# Patient Record
Sex: Male | Born: 1935 | Race: White | Hispanic: No | Marital: Married | Smoking: Never smoker
Health system: Southern US, Community
[De-identification: ages and names within clinical notes are randomized; demographics above are authoritative.]

## PROBLEM LIST (undated history)

## (undated) DIAGNOSIS — I10 Essential (primary) hypertension: Secondary | ICD-10-CM

## (undated) DIAGNOSIS — R109 Unspecified abdominal pain: Secondary | ICD-10-CM

## (undated) DIAGNOSIS — Z972 Presence of dental prosthetic device (complete) (partial): Secondary | ICD-10-CM

## (undated) DIAGNOSIS — N281 Cyst of kidney, acquired: Secondary | ICD-10-CM

## (undated) DIAGNOSIS — Z974 Presence of external hearing-aid: Secondary | ICD-10-CM

## (undated) DIAGNOSIS — D696 Thrombocytopenia, unspecified: Secondary | ICD-10-CM

## (undated) DIAGNOSIS — I878 Other specified disorders of veins: Secondary | ICD-10-CM

## (undated) DIAGNOSIS — N401 Enlarged prostate with lower urinary tract symptoms: Secondary | ICD-10-CM

## (undated) DIAGNOSIS — N138 Other obstructive and reflux uropathy: Secondary | ICD-10-CM

## (undated) DIAGNOSIS — B353 Tinea pedis: Secondary | ICD-10-CM

## (undated) DIAGNOSIS — E785 Hyperlipidemia, unspecified: Secondary | ICD-10-CM

## (undated) DIAGNOSIS — R001 Bradycardia, unspecified: Secondary | ICD-10-CM

## (undated) DIAGNOSIS — H9190 Unspecified hearing loss, unspecified ear: Secondary | ICD-10-CM

## (undated) DIAGNOSIS — N200 Calculus of kidney: Secondary | ICD-10-CM

## (undated) DIAGNOSIS — R55 Syncope and collapse: Secondary | ICD-10-CM

## (undated) DIAGNOSIS — I495 Sick sinus syndrome: Secondary | ICD-10-CM

## (undated) DIAGNOSIS — R000 Tachycardia, unspecified: Secondary | ICD-10-CM

## (undated) DIAGNOSIS — N419 Inflammatory disease of prostate, unspecified: Secondary | ICD-10-CM

## (undated) DIAGNOSIS — E119 Type 2 diabetes mellitus without complications: Secondary | ICD-10-CM

## (undated) DIAGNOSIS — I442 Atrioventricular block, complete: Secondary | ICD-10-CM

## (undated) DIAGNOSIS — D649 Anemia, unspecified: Secondary | ICD-10-CM

## (undated) HISTORY — DX: Type 2 diabetes mellitus without complications: E11.9

## (undated) HISTORY — DX: Essential (primary) hypertension: I10

## (undated) HISTORY — PX: HERNIA REPAIR: SHX51

## (undated) HISTORY — DX: Calculus of kidney: N20.0

## (undated) HISTORY — DX: Other obstructive and reflux uropathy: N13.8

## (undated) HISTORY — DX: Unspecified abdominal pain: R10.9

## (undated) HISTORY — DX: Hyperlipidemia, unspecified: E78.5

## (undated) HISTORY — DX: Other obstructive and reflux uropathy: N40.1

## (undated) HISTORY — DX: Cyst of kidney, acquired: N28.1

---

## 2006-07-17 ENCOUNTER — Ambulatory Visit: Payer: Self-pay | Admitting: Internal Medicine

## 2010-05-04 ENCOUNTER — Ambulatory Visit: Payer: Self-pay | Admitting: Physician Assistant

## 2010-11-15 ENCOUNTER — Ambulatory Visit: Payer: Self-pay | Admitting: Surgery

## 2010-11-21 ENCOUNTER — Ambulatory Visit: Payer: Self-pay | Admitting: Surgery

## 2012-06-13 ENCOUNTER — Emergency Department: Payer: Self-pay | Admitting: Emergency Medicine

## 2012-06-13 LAB — URINALYSIS, COMPLETE
Protein: NEGATIVE
RBC,UR: 26 /HPF (ref 0–5)
Specific Gravity: 1.018 (ref 1.003–1.030)
Squamous Epithelial: NONE SEEN
WBC UR: 3 /HPF (ref 0–5)

## 2012-06-13 LAB — CBC
HGB: 15.3 g/dL (ref 13.0–18.0)
MCH: 28.6 pg (ref 26.0–34.0)
MCV: 84 fL (ref 80–100)
RDW: 13.8 % (ref 11.5–14.5)
WBC: 5.7 10*3/uL (ref 3.8–10.6)

## 2012-06-13 LAB — COMPREHENSIVE METABOLIC PANEL
Albumin: 3.8 g/dL (ref 3.4–5.0)
BUN: 27 mg/dL — ABNORMAL HIGH (ref 7–18)
Bilirubin,Total: 0.7 mg/dL (ref 0.2–1.0)
Calcium, Total: 8.9 mg/dL (ref 8.5–10.1)
Co2: 23 mmol/L (ref 21–32)
EGFR (Non-African Amer.): 46 — ABNORMAL LOW
Glucose: 151 mg/dL — ABNORMAL HIGH (ref 65–99)
Osmolality: 291 (ref 275–301)
Potassium: 3.3 mmol/L — ABNORMAL LOW (ref 3.5–5.1)
SGPT (ALT): 25 U/L (ref 12–78)
Total Protein: 6.6 g/dL (ref 6.4–8.2)

## 2012-06-30 ENCOUNTER — Ambulatory Visit: Payer: Self-pay

## 2012-07-30 ENCOUNTER — Ambulatory Visit: Payer: Self-pay | Admitting: Urology

## 2012-07-30 LAB — BODY FLUID CELL COUNT WITH DIFFERENTIAL
Lymphocytes: 0 %
Nucleated Cell Count: 0 /mm3
Other Cells BF: 0 %
Other Mononuclear Cells: 0 %

## 2012-07-30 LAB — APTT: Activated PTT: 28.4 secs (ref 23.6–35.9)

## 2012-07-30 LAB — PLATELET COUNT: Platelet: 146 10*3/uL — ABNORMAL LOW (ref 150–440)

## 2012-07-31 ENCOUNTER — Ambulatory Visit: Payer: Self-pay | Admitting: Urology

## 2012-08-28 ENCOUNTER — Ambulatory Visit: Payer: Self-pay | Admitting: Urology

## 2012-09-11 ENCOUNTER — Ambulatory Visit: Payer: Self-pay | Admitting: Urology

## 2012-10-01 ENCOUNTER — Ambulatory Visit: Payer: Self-pay | Admitting: Urology

## 2013-01-22 ENCOUNTER — Ambulatory Visit: Payer: Self-pay | Admitting: Urology

## 2013-07-22 DIAGNOSIS — I10 Essential (primary) hypertension: Secondary | ICD-10-CM | POA: Insufficient documentation

## 2013-08-31 ENCOUNTER — Ambulatory Visit: Payer: Self-pay | Admitting: Urology

## 2013-11-16 ENCOUNTER — Emergency Department: Payer: Self-pay | Admitting: Emergency Medicine

## 2013-11-16 LAB — COMPREHENSIVE METABOLIC PANEL
ALK PHOS: 88 U/L
ANION GAP: 9 (ref 7–16)
Albumin: 3.8 g/dL (ref 3.4–5.0)
BUN: 26 mg/dL — ABNORMAL HIGH (ref 7–18)
Bilirubin,Total: 0.8 mg/dL (ref 0.2–1.0)
CREATININE: 1.74 mg/dL — AB (ref 0.60–1.30)
Calcium, Total: 8.3 mg/dL — ABNORMAL LOW (ref 8.5–10.1)
Chloride: 103 mmol/L (ref 98–107)
Co2: 31 mmol/L (ref 21–32)
EGFR (Non-African Amer.): 40 — ABNORMAL LOW
GFR CALC AF AMER: 48 — AB
Glucose: 155 mg/dL — ABNORMAL HIGH (ref 65–99)
Osmolality: 293 (ref 275–301)
Potassium: 4 mmol/L (ref 3.5–5.1)
SGOT(AST): 23 U/L (ref 15–37)
SGPT (ALT): 24 U/L
SODIUM: 143 mmol/L (ref 136–145)
Total Protein: 6.9 g/dL (ref 6.4–8.2)

## 2013-11-16 LAB — CBC WITH DIFFERENTIAL/PLATELET
BASOS ABS: 0 10*3/uL (ref 0.0–0.1)
Basophil %: 0.1 %
EOS ABS: 0 10*3/uL (ref 0.0–0.7)
Eosinophil %: 0.2 %
HCT: 45.4 % (ref 40.0–52.0)
HGB: 14.6 g/dL (ref 13.0–18.0)
Lymphocyte #: 0.7 10*3/uL — ABNORMAL LOW (ref 1.0–3.6)
Lymphocyte %: 7.1 %
MCH: 27 pg (ref 26.0–34.0)
MCHC: 32.2 g/dL (ref 32.0–36.0)
MCV: 84 fL (ref 80–100)
MONOS PCT: 9.5 %
Monocyte #: 0.9 x10 3/mm (ref 0.2–1.0)
Neutrophil #: 8.1 10*3/uL — ABNORMAL HIGH (ref 1.4–6.5)
Neutrophil %: 83.1 %
Platelet: 146 10*3/uL — ABNORMAL LOW (ref 150–440)
RBC: 5.41 10*6/uL (ref 4.40–5.90)
RDW: 15.1 % — ABNORMAL HIGH (ref 11.5–14.5)
WBC: 9.8 10*3/uL (ref 3.8–10.6)

## 2013-11-16 LAB — URINALYSIS, COMPLETE
BILIRUBIN, UR: NEGATIVE
BLOOD: NEGATIVE
Bacteria: NONE SEEN
GLUCOSE, UR: NEGATIVE mg/dL (ref 0–75)
Leukocyte Esterase: NEGATIVE
NITRITE: NEGATIVE
Ph: 5 (ref 4.5–8.0)
Protein: 30
RBC,UR: 2 /HPF (ref 0–5)
SQUAMOUS EPITHELIAL: NONE SEEN
Specific Gravity: 1.02 (ref 1.003–1.030)
WBC UR: 3 /HPF (ref 0–5)

## 2013-12-29 ENCOUNTER — Ambulatory Visit: Payer: Self-pay | Admitting: Internal Medicine

## 2014-06-26 IMAGING — CR DG ABDOMEN 1V
1 series · 2 of 2 positions shown · non-contrast
Comparison: none

REASON FOR EXAM: Calculus
COMMENTS:

[Series 1: t abdomen supine · 0.14mm/px · 2 of 2 slices shown]
[im 1/2]
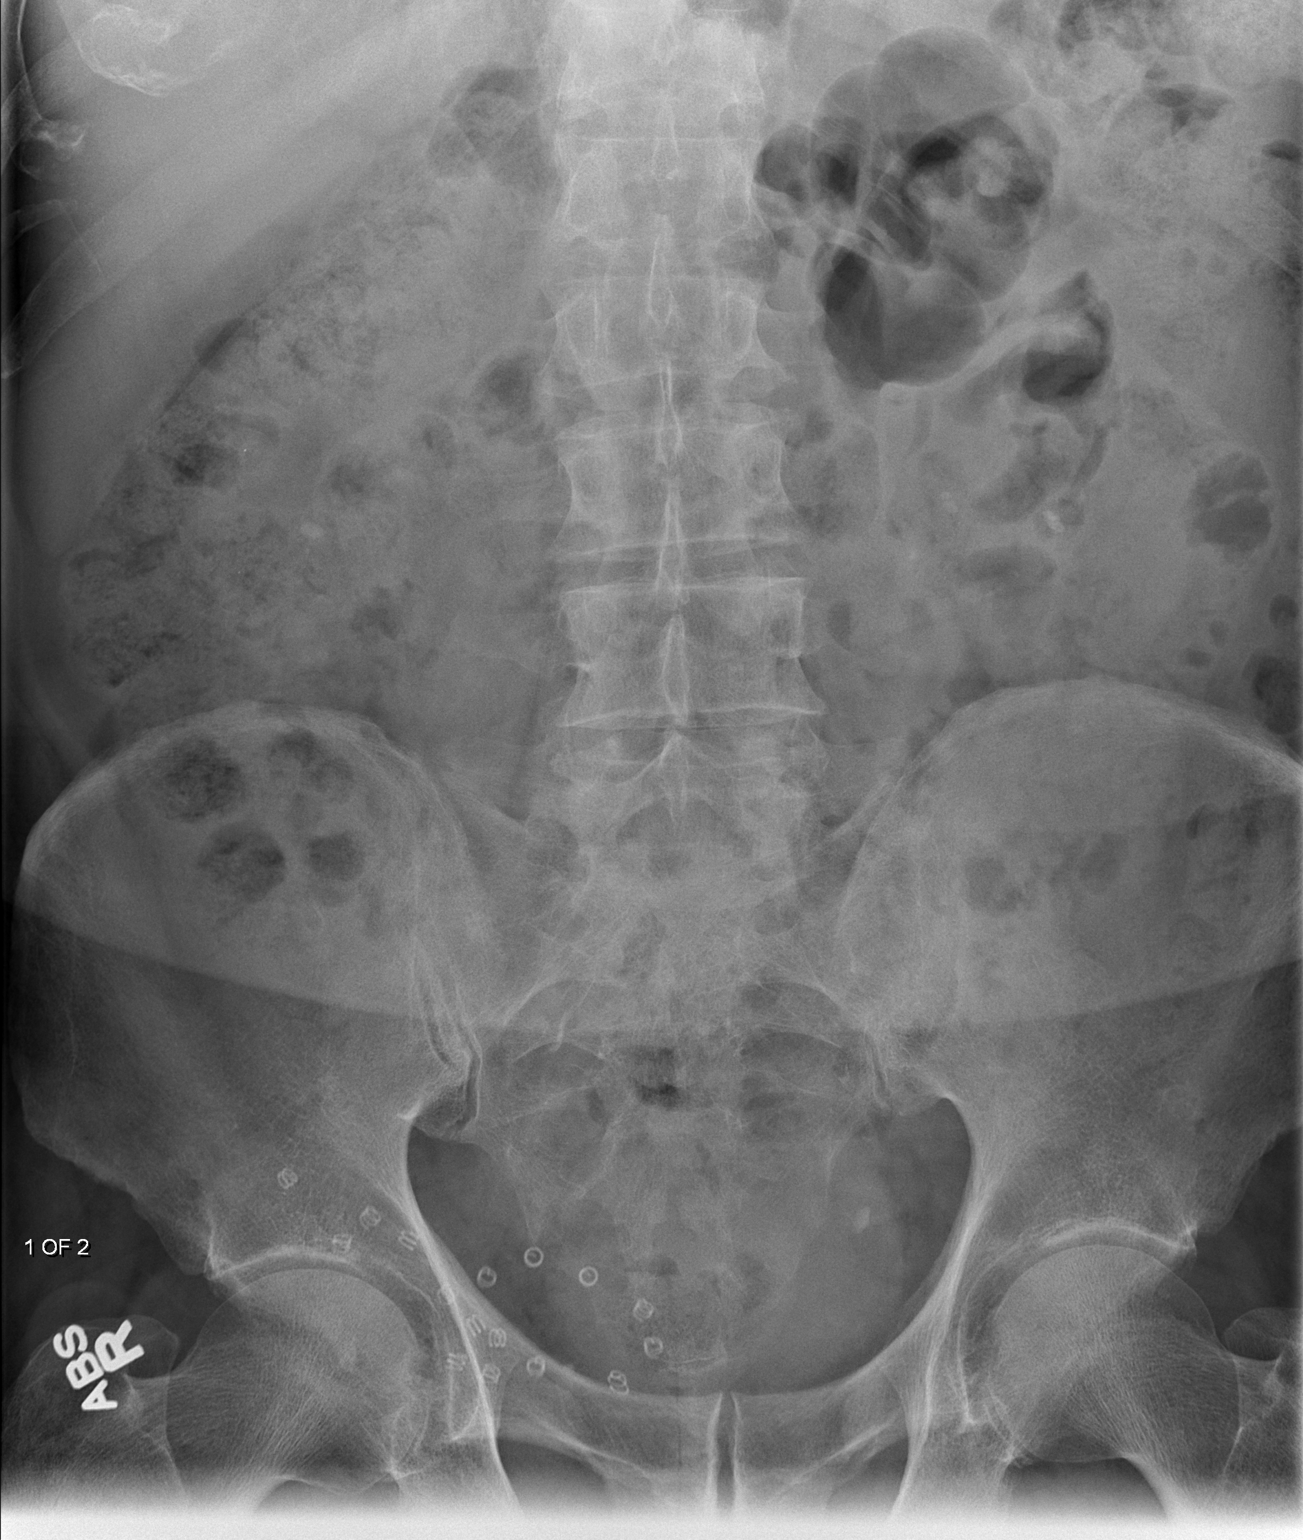
[im 2/2]
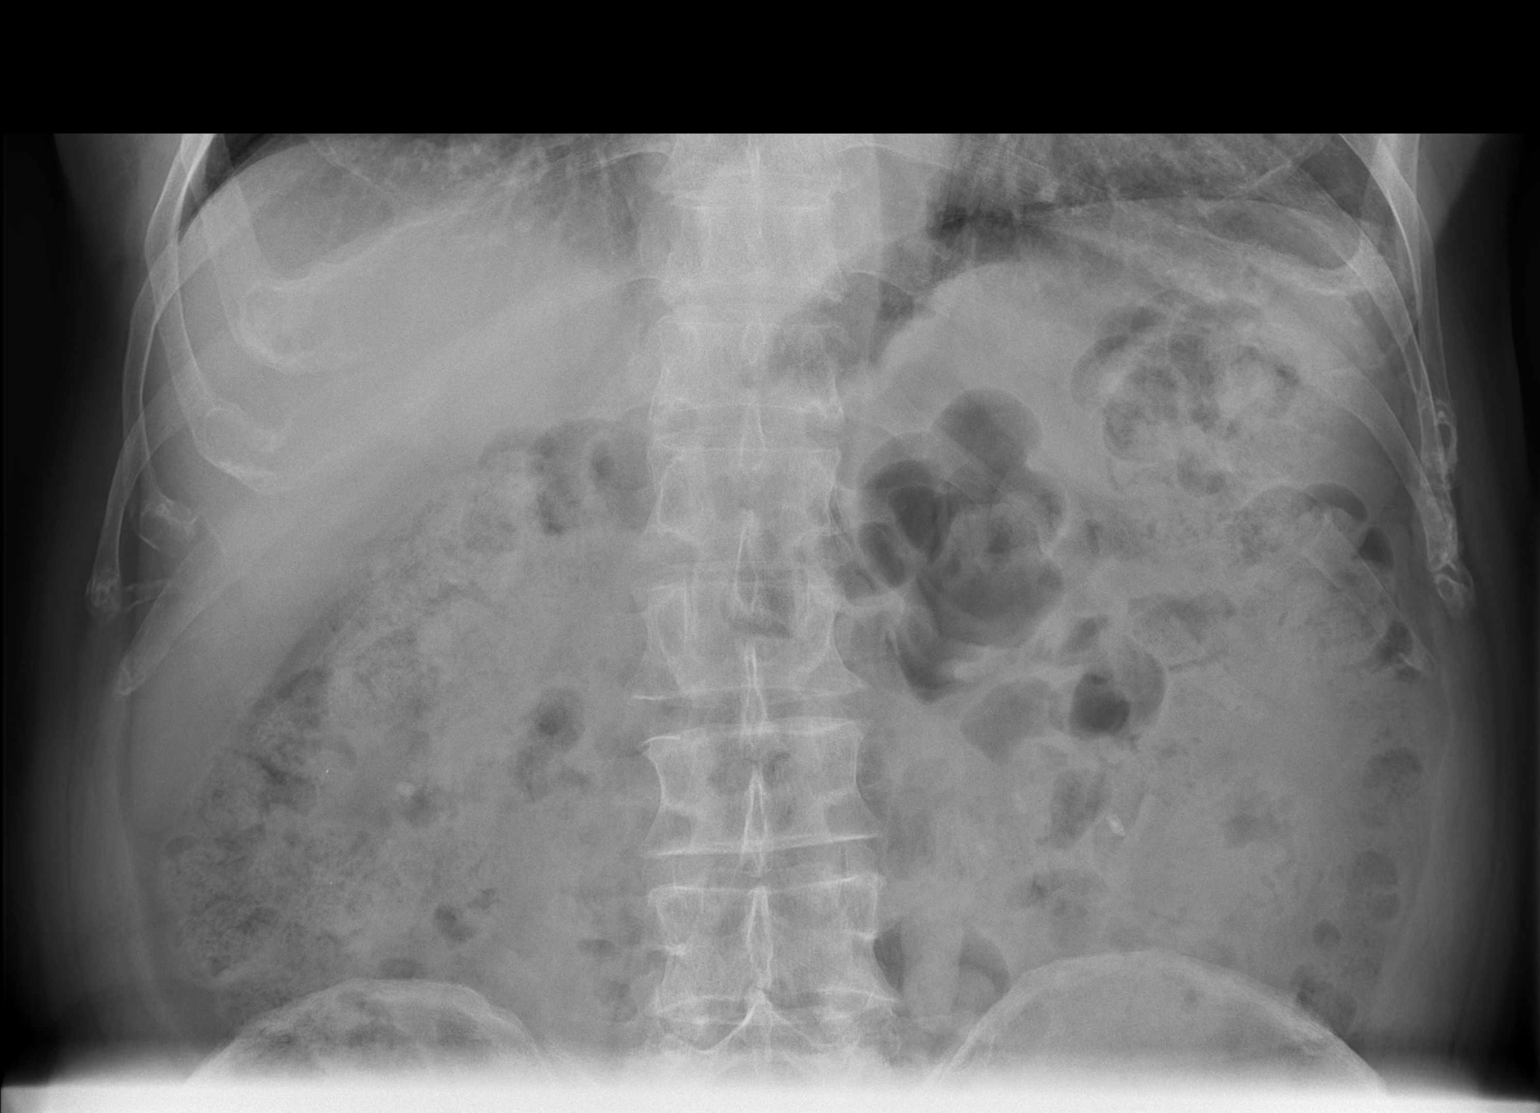

[2 of 2 positions shown; findings below may reference images not displayed]

PROCEDURE:     DXR - DXR KIDNEY URETER BLADDER  - September 11, 2012 [DATE]

RESULT:     Comparison is made to the study of 08/28/2012.

Stones are present in both kidneys consistent with bilateral
nephrolithiasis. There is a moderate amount of fecal material scattered
through the colon to the rectum. No definite ureteral stones are
appreciated. There is stable calcification in the left pelvis this could
represent a distal ureteral calcification normal left.
IMPRESSION: Stable appearance.

[REDACTED]

## 2014-08-06 ENCOUNTER — Other Ambulatory Visit: Payer: Self-pay | Admitting: Internal Medicine

## 2014-08-06 ENCOUNTER — Ambulatory Visit
Admission: RE | Admit: 2014-08-06 | Discharge: 2014-08-06 | Disposition: A | Discharge status: Home / Self Care | Payer: Medicare Other | Source: Ambulatory Visit | Attending: Internal Medicine | Admitting: Internal Medicine

## 2014-08-06 DIAGNOSIS — M25473 Effusion, unspecified ankle: Secondary | ICD-10-CM

## 2014-09-03 ENCOUNTER — Ambulatory Visit: Payer: Self-pay | Admitting: Urology

## 2014-09-07 ENCOUNTER — Encounter: Payer: Self-pay | Admitting: Urology

## 2014-09-17 ENCOUNTER — Ambulatory Visit (INDEPENDENT_AMBULATORY_CARE_PROVIDER_SITE_OTHER): Payer: Medicare Other | Admitting: Urology

## 2014-09-17 ENCOUNTER — Encounter: Payer: Self-pay | Admitting: Urology

## 2014-09-17 ENCOUNTER — Other Ambulatory Visit: Payer: Self-pay | Admitting: *Deleted

## 2014-09-17 VITALS — BP 190/80 | HR 57 | Ht 68.0 in | Wt 189.8 lb

## 2014-09-17 DIAGNOSIS — N200 Calculus of kidney: Secondary | ICD-10-CM

## 2014-09-17 DIAGNOSIS — R339 Retention of urine, unspecified: Secondary | ICD-10-CM

## 2014-09-17 LAB — URINALYSIS, COMPLETE
BILIRUBIN UA: NEGATIVE
Glucose, UA: NEGATIVE
Ketones, UA: NEGATIVE
LEUKOCYTES UA: NEGATIVE
Nitrite, UA: NEGATIVE
PH UA: 7 (ref 5.0–7.5)
PROTEIN UA: NEGATIVE
RBC UA: NEGATIVE
SPEC GRAV UA: 1.02 (ref 1.005–1.030)
Urobilinogen, Ur: 0.2 mg/dL (ref 0.2–1.0)

## 2014-09-17 LAB — MICROSCOPIC EXAMINATION
BACTERIA UA: NONE SEEN
Epithelial Cells (non renal): NONE SEEN /hpf (ref 0–10)

## 2014-09-17 LAB — BLADDER SCAN AMB NON-IMAGING: Scan Result: 430

## 2014-09-17 MED ORDER — CIPROFLOXACIN HCL 500 MG PO TABS
500.0000 mg | ORAL_TABLET | Freq: Once | ORAL | Status: AC
  Administered 2014-09-17: 500 mg via ORAL

## 2014-09-17 MED ORDER — TAMSULOSIN HCL 0.4 MG PO CAPS
0.4000 mg | ORAL_CAPSULE | Freq: Every day | ORAL | Status: DC
Start: 2014-09-17 — End: 2021-09-21

## 2014-09-17 MED ORDER — LIDOCAINE HCL 2 % EX GEL
1.0000 "application " | Freq: Once | CUTANEOUS | Status: AC
  Administered 2014-09-17: 1 via URETHRAL

## 2014-09-17 MED ORDER — FINASTERIDE 5 MG PO TABS: 5.0000 mg | ORAL_TABLET | Freq: Every day | ORAL | Status: DC

## 2014-09-17 NOTE — Progress Notes (Signed)
    Cystoscopy Procedure Note  Patient identification was confirmed, informed consent was obtained, and patient was prepped using Betadine solution.  Lidocaine jelly was administered per urethral meatus.    Preoperative abx where received prior to procedure.     Pre-Procedure: - Inspection reveals a normal caliber ureteral meatus.  Procedure: The flexible cystoscope was introduced without difficulty - No urethral strictures/lesions are present. -  prostate is some middle lobe hypertrophy with some lateral lobe hypertrophy does not appear to be a surgical prostate yet. -  bladder neck middle lobe hypertrophy of the prostate - Bilateral ureteral orifices identified - Bladder mucosa  reveals no ulcers, tumors, or lesions - No bladder stones - trabeculation grade 3 trabeculation  Retroflexion shows no tumors or masses in the anterior portion of the prostate date or the bladder   Post-Procedure: - Patient tolerated the procedure well \

## 2014-09-17 NOTE — Progress Notes (Signed)
09/17/2014 10:39 AM   Albert Harrison 04-28-1925 [id number]  Referring provider: Tracie Harrier, MD [place], [place] [address]  Chief Complaint  Patient presents with  . Follow-up    22yr f/u of kidney stones    HPI: Today the patient is complaining of some leakage of urine and inability to void large amounts of urine. He is having no calculi pain. Normally he is checked for a renal ultrasound before coming in but he did not obtain on this time so I ordered a renal ultrasound to check on the progress of his cyst after drainage of 1800 mL of a renal cyst on the left. This is a percutaneous drainage. He is having none of the signs or symptoms of this large cyst that he had in the past.   PMH: Past Medical History  Diagnosis Date  . Hyperlipemia   . Hypertension   . Renal cyst   . BPH (benign prostatic hypertrophy) with urinary obstruction   . Diabetes   . Abdominal pain   . Renal stone     Surgical History: Past Surgical History  Procedure Laterality Date  . Hernia repair      Home Medications:    Medication List       This list is accurate as of: 09/17/14 10:39 AM.  Always use your most recent med list.               Coenzyme Q10 200 MG capsule  Take by mouth.     finasteride 5 MG tablet  Commonly known as:  PROSCAR  Take 1 tablet (5 mg total) by mouth daily.     furosemide 20 MG tablet  Commonly known as:  LASIX  Take by mouth.     hydrochlorothiazide 25 MG tablet  Commonly known as:  HYDRODIURIL  Take by mouth.     ibuprofen 200 MG tablet  Commonly known as:  ADVIL,MOTRIN  Take by mouth.     losartan 50 MG tablet  Commonly known as:  COZAAR  Take by mouth.     simvastatin 40 MG tablet  Commonly known as:  ZOCOR  Take by mouth.     tamsulosin 0.4 MG Caps capsule  Commonly known as:  FLOMAX  Take by mouth.     tamsulosin 0.4 MG Caps capsule  Commonly known as:  FLOMAX  Take 1 capsule (0.4 mg total) by mouth daily.          Allergies:  Allergies  Allergen Reactions  . Morphine Other (See Comments)    Family History: No family history on file.  Social History:  reports that he has never smoked. He does not have any smokeless tobacco history on file. He reports that he does not drink alcohol or use illicit drugs.  ROS: UROLOGY Frequent Urination?: Yes Hard to postpone urination?: No Burning/pain with urination?: No Get up at night to urinate?: No Leakage of urine?: Yes Urine stream starts and stops?: No Trouble starting stream?: No Do you have to strain to urinate?: Yes Blood in urine?: No Urinary tract infection?: No Sexually transmitted disease?: No Injury to kidneys or bladder?: No Painful intercourse?: No Weak stream?: No Erection problems?: No Penile pain?: No  Gastrointestinal Nausea?: No Vomiting?: No Indigestion/heartburn?: No Diarrhea?: No Constipation?: No  Constitutional Fever: No Night sweats?: No Weight loss?: No Fatigue?: No  Skin Skin rash/lesions?: No Itching?: No  Eyes Blurred vision?: No Double vision?: No  Ears/Nose/Throat Sore throat?: No Sinus problems?: No  Hematologic/Lymphatic Swollen glands?: No Easy bruising?: No  Cardiovascular Leg swelling?: No Chest pain?: No  Respiratory Cough?: No Shortness of breath?: No  Endocrine Excessive thirst?: No  Musculoskeletal Back pain?: No Joint pain?: Yes  Neurological Headaches?: No Dizziness?: No  Psychologic Depression?: No Anxiety?: No  Physical Exam: BP 190/80 mmHg  Pulse 57  Ht 5\' 8"  (1.727 m)  Wt 189 lb 12.8 oz (86.093 kg)  BMI 28.87 kg/m2  Constitutional:  Alert and oriented, No acute distress. HEENT: [place] AT, moist mucus membranes.  Trachea midline, no masses. Cardiovascular: No clubbing, cyanosis, or edema. Respiratory: Normal respiratory effort, no increased work of breathing. GI: Abdomen is soft, nontender, nondistended, no abdominal masses GU: No CVA tenderness.  Negative Lloyd's Murphy's a McBurney's Skin: No rashes, bruises or suspicious lesions. Lymph: No cervical or inguinal adenopathy. Neurologic: Grossly intact, no focal deficits, moving all 4 extremities. Psychiatric: Normal mood and affect.  Laboratory Data: Lab Results  Component Value Date   WBC 9.8 11/16/2013   HGB 14.6 11/16/2013   HCT 45.4 11/16/2013   MCV 84 11/16/2013   PLT 146* 11/16/2013    Lab Results  Component Value Date   CREATININE 1.74* 11/16/2013    No results found for: PSA  No results found for: TESTOSTERONE  No results found for: HGBA1C  Urinalysis    Component Value Date/Time   COLORURINE Yellow 11/16/2013 0945   APPEARANCEUR Clear 11/16/2013 0945   LABSPEC 1.020 11/16/2013 0945   PHURINE 5.0 11/16/2013 0945   GLUCOSEU Negative 11/16/2013 0945   HGBUR Negative 11/16/2013 0945   BILIRUBINUR Negative 11/16/2013 0945   KETONESUR Trace 11/16/2013 0945   PROTEINUR 30 mg/dL 11/16/2013 0945   NITRITE Negative 11/16/2013 0945   LEUKOCYTESUR Negative 11/16/2013 0945    Pertinent Imaging: Order renal ultrasound  Assessment & Plan:  History of stones renal cyst for which I'll get an ultrasound to see if it's come back these had 1800 mL in the last cyst drainage. He is experiencing no abdominal pain at this time and no distention so I doubt this cyst is come back in his large size. There still is some cyst present on his last check in 6 months ago. His also complaining of frequency of urination and inability to void once he leaks. Postvoid residual beef was 400 mL. Cystoscopic exam revealed some BPH probably partially obstructive and I put him on tamsulosin and finasteride. I do not think is operable prostate. Rectal he has some slight area of calcification or nodularity on the right side. Does not feel like a cancerous nodule. At 89 I'm reticent to  obtain a PSA  1. Kidney stones historical previous lithotripsy for a obstructing left  upper ureter stone no  signs or symptoms of calculus N spell removal obtain a renal ultrasound for s - Urinalysis, Complete - US Renal; Future  2. Urinary retention Secondary to BPH and patient's and placed on finasteride and tamsulosin postvoid residual before voiding second time was 410 voided about 250-300 so I think his outlet obstructions minimal and but present that is why put him on the tamsulosin and finasteride follow-ups in a m - Bladder Scan (Post Void Residual) in office - ciprofloxacin (CIPRO) tablet 500 mg; Take 1 tablet (500 mg total) by mouth once. - lidocaine (XYLOCAINE) 2 % jelly 1 application; Place 1 application into the urethra once. - US Renal; Future - finasteride (PROSCAR) 5 MG tablet; Take 1 tablet (5 mg total) by mouth daily.  Dispense: 90 tablet; Refill:  2 - tamsulosin (FLOMAX) 0.4 MG CAPS capsule; Take 1 capsule (0.4 mg total) by mouth daily.  Dispense: 90 capsule; Refill: 1   No Follow-up on file.  Collier Flowers, [facility] [address] [place], [place] [address] [phone]

## 2014-10-08 ENCOUNTER — Ambulatory Visit: Payer: Medicare Other

## 2014-10-22 ENCOUNTER — Ambulatory Visit
Admission: RE | Admit: 2014-10-22 | Discharge: 2014-10-22 | Disposition: A | Discharge status: Home / Self Care | Payer: Medicare Other | Source: Ambulatory Visit | Attending: Urology | Admitting: Urology

## 2014-10-22 DIAGNOSIS — N200 Calculus of kidney: Secondary | ICD-10-CM | POA: Insufficient documentation

## 2014-10-22 DIAGNOSIS — N281 Cyst of kidney, acquired: Secondary | ICD-10-CM | POA: Insufficient documentation

## 2014-10-22 DIAGNOSIS — R339 Retention of urine, unspecified: Secondary | ICD-10-CM

## 2014-10-26 ENCOUNTER — Encounter: Payer: Self-pay | Admitting: Urology

## 2014-10-26 ENCOUNTER — Ambulatory Visit (INDEPENDENT_AMBULATORY_CARE_PROVIDER_SITE_OTHER): Payer: Medicare Other | Admitting: Urology

## 2014-10-26 VITALS — BP 159/71 | HR 59 | Ht 69.0 in | Wt 192.9 lb

## 2014-10-26 DIAGNOSIS — N400 Enlarged prostate without lower urinary tract symptoms: Secondary | ICD-10-CM | POA: Diagnosis not present

## 2014-10-26 DIAGNOSIS — D649 Anemia, unspecified: Secondary | ICD-10-CM | POA: Insufficient documentation

## 2014-10-26 DIAGNOSIS — Q6100 Congenital renal cyst, unspecified: Secondary | ICD-10-CM

## 2014-10-26 DIAGNOSIS — N281 Cyst of kidney, acquired: Secondary | ICD-10-CM

## 2014-10-26 DIAGNOSIS — H9190 Unspecified hearing loss, unspecified ear: Secondary | ICD-10-CM | POA: Insufficient documentation

## 2014-10-26 DIAGNOSIS — R350 Frequency of micturition: Secondary | ICD-10-CM | POA: Diagnosis not present

## 2014-10-26 DIAGNOSIS — E785 Hyperlipidemia, unspecified: Secondary | ICD-10-CM | POA: Insufficient documentation

## 2014-10-26 LAB — MICROSCOPIC EXAMINATION
BACTERIA UA: NONE SEEN
Epithelial Cells (non renal): NONE SEEN /hpf (ref 0–10)

## 2014-10-26 LAB — URINALYSIS, COMPLETE
Bilirubin, UA: NEGATIVE
Glucose, UA: NEGATIVE
Ketones, UA: NEGATIVE
Leukocytes, UA: NEGATIVE
NITRITE UA: NEGATIVE
PH UA: 6 (ref 5.0–7.5)
Protein, UA: NEGATIVE
RBC UA: NEGATIVE
Specific Gravity, UA: 1.02 (ref 1.005–1.030)
UUROB: 0.2 mg/dL (ref 0.2–1.0)

## 2014-10-26 NOTE — Progress Notes (Signed)
10/26/2014 2:03 PM   Albert Harrison 03/20/1925 [id number]  Referring provider: Tracie Harrier, MD [address] [place] [facility] [place], [place] [address]  Chief Complaint  Patient presents with  . Follow-up    renal ultrasound for history stones renal cyst , BPH    HPI: Previous history large renal cyst 1800 mL capacity. Presently cystoscopy is 11 x 7 x 12 cm. Has some echogenicity. It is not painful to him. We will redo the ultrasound in a year. There are no masses or stones in his kidney he is voiding well.     PMH: Past Medical History  Diagnosis Date  . Hyperlipemia   . Hypertension   . Renal cyst   . BPH (benign prostatic hypertrophy) with urinary obstruction   . Diabetes ([place])   . Abdominal pain   . Renal stone     Surgical History: Past Surgical History  Procedure Laterality Date  . Hernia repair      Home Medications:    Medication List       This list is accurate as of: 10/26/14  2:03 PM.  Always use your most recent med list.               Coenzyme Q10 200 MG capsule  Take by mouth.     finasteride 5 MG tablet  Commonly known as:  PROSCAR  Take 1 tablet (5 mg total) by mouth daily.     furosemide 20 MG tablet  Commonly known as:  LASIX  Take by mouth.     hydrochlorothiazide 25 MG tablet  Commonly known as:  HYDRODIURIL  Take by mouth.     ibuprofen 200 MG tablet  Commonly known as:  ADVIL,MOTRIN  Take by mouth.     losartan 50 MG tablet  Commonly known as:  COZAAR  Take by mouth.     simvastatin 40 MG tablet  Commonly known as:  ZOCOR  Take by mouth.     tamsulosin 0.4 MG Caps capsule  Commonly known as:  FLOMAX  Take by mouth.     tamsulosin 0.4 MG Caps capsule  Commonly known as:  FLOMAX  Take 1 capsule (0.4 mg total) by mouth daily.        Allergies:  Allergies  Allergen Reactions  . Morphine Other (See Comments)    Family History: No family history on file.  Social History:  reports that he has  never smoked. He does not have any smokeless tobacco history on file. He reports that he does not drink alcohol or use illicit drugs.  ROS: UROLOGY Frequent Urination?: Yes Hard to postpone urination?: No Burning/pain with urination?: No Get up at night to urinate?: No Leakage of urine?: No Urine stream starts and stops?: No Trouble starting stream?: No Do you have to strain to urinate?: No Blood in urine?: No Urinary tract infection?: No Sexually transmitted disease?: No Injury to kidneys or bladder?: No Painful intercourse?: No Weak stream?: No Erection problems?: No Penile pain?: No  Gastrointestinal Nausea?: No Vomiting?: No Indigestion/heartburn?: No Diarrhea?: No Constipation?: No  Constitutional Fever: No Night sweats?: No Weight loss?: No Fatigue?: No  Skin Skin rash/lesions?: No  Eyes Blurred vision?: No Double vision?: No  Ears/Nose/Throat Sore throat?: No Sinus problems?: No  Hematologic/Lymphatic Swollen glands?: No Easy bruising?: No  Cardiovascular Leg swelling?: No Chest pain?: No  Respiratory Cough?: No Shortness of breath?: No  Endocrine Excessive thirst?: No  Musculoskeletal Back pain?: No Joint pain?: Yes  Neurological Headaches?: No Dizziness?: No  Psychologic Depression?: No Anxiety?: No  Physical Exam: BP 159/71 mmHg  Pulse 59  Ht 5\' 9"  (1.753 m)  Wt 192 lb 14.4 oz (87.499 kg)  BMI 28.47 kg/m2  Constitutional:  Alert and oriented, No acute distress. HEENT: [place] AT, moist mucus membranes.  Trachea midline, no masses. Cardiovascular: No clubbing, cyanosis, or edema. Respiratory: Normal respiratory effort, no increased work of breathing. GI: Abdomen is soft, nontender, nondistended, no abdominal masses GU: No CVA tenderness.  Skin: No rashes, bruises or suspicious lesions. Lymph: No cervical or inguinal adenopathy. Neurologic: Grossly intact, no focal deficits, moving all 4 extremities. Psychiatric: Normal mood  and affect.  Laboratory Data: Lab Results  Component Value Date   WBC 9.8 11/16/2013   HGB 14.6 11/16/2013   HCT 45.4 11/16/2013   MCV 84 11/16/2013   PLT 146* 11/16/2013    Lab Results  Component Value Date   CREATININE 1.74* 11/16/2013    No results found for: PSA  No results found for: TESTOSTERONE  No results found for: HGBA1C  Urinalysis    Component Value Date/Time   COLORURINE Yellow 11/16/2013 0945   APPEARANCEUR Clear 11/16/2013 0945   LABSPEC 1.020 11/16/2013 0945   PHURINE 5.0 11/16/2013 0945   GLUCOSEU Negative 09/17/2014 0952   GLUCOSEU Negative 11/16/2013 0945   HGBUR Negative 11/16/2013 0945   BILIRUBINUR Negative 09/17/2014 0952   BILIRUBINUR Negative 11/16/2013 0945   KETONESUR Trace 11/16/2013 0945   PROTEINUR 30 mg/dL 11/16/2013 0945   NITRITE Negative 09/17/2014 0952   NITRITE Negative 11/16/2013 0945   LEUKOCYTESUR Negative 09/17/2014 0952   LEUKOCYTESUR Negative 11/16/2013 0945    Pertinent Imaging none  Assessment & Plan:  Previous large renal cyst 1800 mL capacity with deviation of the ureter on the left side. He's had this drained 2 years ago and is doing quite well. He has no pain and he has a very much decreased renal cyst size. He has no calculi in his kidneys. I will get another ultrasound in a year. This will be good screening for his renal calculi and his large cyst at has been drained fairly successfully. There is some complexity of the cyst but I would suspect that is had a drain. He is having no fever chills or pain. When he had the large 1800 mL cyst his abdomen was distended by. He is quite comfortable now not complaining of any  pain  1. BPH (benign prostatic hyperplasia) voiding well now with good stream. Will maintain the finasteride. Thrill he helped his frequency and urgency.  2.frequency Stopped by using tamsulosin and finasteride. I told him he could stop the tamsulosin just take finasteride see how that works. -  Urinalysis, Complete   No Follow-up on file.  Collier Flowers, [facility] [address] [place], [place] [address] [phone]

## 2014-10-26 NOTE — Addendum Note (Signed)
Addended by: Rick Duff D on: 10/26/2014 03:38 PM   Modules accepted: Orders

## 2014-11-16 ENCOUNTER — Other Ambulatory Visit: Payer: Self-pay

## 2014-11-16 DIAGNOSIS — N281 Cyst of kidney, acquired: Secondary | ICD-10-CM

## 2015-10-28 ENCOUNTER — Ambulatory Visit: Payer: Medicare Other | Admitting: Urology

## 2017-06-09 ENCOUNTER — Emergency Department: Payer: Medicare Other

## 2017-06-09 ENCOUNTER — Observation Stay
Admission: EM | Admit: 2017-06-09 | Discharge: 2017-06-10 | Disposition: A | Discharge status: Home / Self Care | Payer: Medicare Other | Attending: Internal Medicine | Admitting: Internal Medicine

## 2017-06-09 ENCOUNTER — Other Ambulatory Visit: Payer: Self-pay

## 2017-06-09 ENCOUNTER — Encounter: Payer: Self-pay | Admitting: Emergency Medicine

## 2017-06-09 DIAGNOSIS — Z87442 Personal history of urinary calculi: Secondary | ICD-10-CM | POA: Insufficient documentation

## 2017-06-09 DIAGNOSIS — Z885 Allergy status to narcotic agent status: Secondary | ICD-10-CM | POA: Diagnosis not present

## 2017-06-09 DIAGNOSIS — I10 Essential (primary) hypertension: Secondary | ICD-10-CM | POA: Diagnosis not present

## 2017-06-09 DIAGNOSIS — E119 Type 2 diabetes mellitus without complications: Secondary | ICD-10-CM | POA: Insufficient documentation

## 2017-06-09 DIAGNOSIS — I4510 Unspecified right bundle-branch block: Secondary | ICD-10-CM | POA: Diagnosis not present

## 2017-06-09 DIAGNOSIS — I441 Atrioventricular block, second degree: Secondary | ICD-10-CM | POA: Insufficient documentation

## 2017-06-09 DIAGNOSIS — E785 Hyperlipidemia, unspecified: Secondary | ICD-10-CM | POA: Diagnosis not present

## 2017-06-09 DIAGNOSIS — R001 Bradycardia, unspecified: Principal | ICD-10-CM | POA: Insufficient documentation

## 2017-06-09 DIAGNOSIS — Z79899 Other long term (current) drug therapy: Secondary | ICD-10-CM | POA: Diagnosis not present

## 2017-06-09 DIAGNOSIS — R55 Syncope and collapse: Secondary | ICD-10-CM | POA: Insufficient documentation

## 2017-06-09 DIAGNOSIS — N401 Enlarged prostate with lower urinary tract symptoms: Secondary | ICD-10-CM | POA: Insufficient documentation

## 2017-06-09 DIAGNOSIS — E876 Hypokalemia: Secondary | ICD-10-CM | POA: Insufficient documentation

## 2017-06-09 LAB — URINALYSIS, COMPLETE (UACMP) WITH MICROSCOPIC
BACTERIA UA: NONE SEEN
Bilirubin Urine: NEGATIVE
Glucose, UA: NEGATIVE mg/dL
HGB URINE DIPSTICK: NEGATIVE
KETONES UR: NEGATIVE mg/dL
Leukocytes, UA: NEGATIVE
Nitrite: NEGATIVE
PROTEIN: NEGATIVE mg/dL
Specific Gravity, Urine: 1.006 (ref 1.005–1.030)
Squamous Epithelial / LPF: NONE SEEN (ref 0–5)
pH: 6 (ref 5.0–8.0)

## 2017-06-09 LAB — BASIC METABOLIC PANEL
Anion gap: 13 (ref 5–15)
BUN: 21 mg/dL — AB (ref 6–20)
CO2: 26 mmol/L (ref 22–32)
CREATININE: 1.14 mg/dL (ref 0.61–1.24)
Calcium: 9 mg/dL (ref 8.9–10.3)
Chloride: 102 mmol/L (ref 101–111)
GFR calc Af Amer: >60 mL/min (ref >60)
GFR calc non Af Amer: 55 mL/min — ABNORMAL LOW (ref >60)
Glucose, Bld: 171 mg/dL — ABNORMAL HIGH (ref 65–99)
Potassium: 3.4 mmol/L — ABNORMAL LOW (ref 3.5–5.1)
Sodium: 141 mmol/L (ref 135–145)

## 2017-06-09 LAB — MAGNESIUM: MAGNESIUM: 1.9 mg/dL (ref 1.7–2.4)

## 2017-06-09 LAB — CBC WITH DIFFERENTIAL/PLATELET
Basophils Absolute: 0 10*3/uL (ref 0–0.1)
Basophils Relative: 0 %
EOS ABS: 0 10*3/uL (ref 0–0.7)
Eosinophils Relative: 1 %
HCT: 49.8 % (ref 40.0–52.0)
Hemoglobin: 17 g/dL (ref 13.0–18.0)
Lymphocytes Relative: 13 %
Lymphs Abs: 0.9 10*3/uL — ABNORMAL LOW (ref 1.0–3.6)
MCH: 30.7 pg (ref 26.0–34.0)
MCHC: 34.1 g/dL (ref 32.0–36.0)
MCV: 90.2 fL (ref 80.0–100.0)
MONO ABS: 0.5 10*3/uL (ref 0.2–1.0)
MONOS PCT: 7 %
Neutro Abs: 5.6 10*3/uL (ref 1.4–6.5)
Neutrophils Relative %: 79 %
Platelets: 122 10*3/uL — ABNORMAL LOW (ref 150–440)
RBC: 5.52 MIL/uL (ref 4.40–5.90)
RDW: 14.1 % (ref 11.5–14.5)
WBC: 7.1 10*3/uL (ref 3.8–10.6)

## 2017-06-09 LAB — TROPONIN I
Troponin I: <0.03 ng/mL (ref <0.03)
Troponin I: <0.03 ng/mL (ref <0.03)
Troponin I: <0.03 ng/mL (ref <0.03)

## 2017-06-09 LAB — PROTIME-INR
INR: 0.95
PROTHROMBIN TIME: 12.6 s (ref 11.4–15.2)

## 2017-06-09 LAB — GLUCOSE, CAPILLARY: GLUCOSE-CAPILLARY: 135 mg/dL — AB (ref 65–99)

## 2017-06-09 LAB — APTT: aPTT: <24 seconds — ABNORMAL LOW (ref 24–36)

## 2017-06-09 MED ORDER — INSULIN NPH (HUMAN) (ISOPHANE) 100 UNIT/ML ~~LOC~~ SUSP: 20.0000 [IU] | Freq: Once | SUBCUTANEOUS | Status: DC

## 2017-06-09 MED ORDER — SODIUM CHLORIDE 0.9 % IV BOLUS
500.0000 mL | Freq: Once | INTRAVENOUS | Status: AC
  Administered 2017-06-09: 500 mL via INTRAVENOUS

## 2017-06-09 MED ORDER — TAMSULOSIN HCL 0.4 MG PO CAPS: 0.4000 mg | ORAL_CAPSULE | Freq: Every day | ORAL | Status: DC

## 2017-06-09 MED ORDER — ENOXAPARIN SODIUM 40 MG/0.4ML ~~LOC~~ SOLN
40.0000 mg | SUBCUTANEOUS | Status: DC
  Administered 2017-06-09: 40 mg via SUBCUTANEOUS
  Filled 2017-06-09: qty 0.4

## 2017-06-09 MED ORDER — FINASTERIDE 5 MG PO TABS: 5.0000 mg | ORAL_TABLET | Freq: Every day | ORAL | Status: DC

## 2017-06-09 MED ORDER — ACETAMINOPHEN 650 MG RE SUPP: 650.0000 mg | Freq: Four times a day (QID) | RECTAL | Status: DC | PRN

## 2017-06-09 MED ORDER — SODIUM CHLORIDE 0.9% FLUSH: 3.0000 mL | INTRAVENOUS | Status: DC | PRN

## 2017-06-09 MED ORDER — POTASSIUM CHLORIDE CRYS ER 20 MEQ PO TBCR: 40.0000 meq | EXTENDED_RELEASE_TABLET | Freq: Once | ORAL | Status: DC

## 2017-06-09 MED ORDER — ACETAMINOPHEN 325 MG PO TABS: 650.0000 mg | ORAL_TABLET | Freq: Four times a day (QID) | ORAL | Status: DC | PRN

## 2017-06-09 MED ORDER — SODIUM CHLORIDE 0.9% FLUSH: 3.0000 mL | Freq: Two times a day (BID) | INTRAVENOUS | Status: DC

## 2017-06-09 MED ORDER — SODIUM CHLORIDE 0.9 % IV SOLN: INTRAVENOUS | Status: DC

## 2017-06-09 MED ORDER — SODIUM CHLORIDE 0.9% FLUSH
3.0000 mL | Freq: Two times a day (BID) | INTRAVENOUS | Status: DC
  Administered 2017-06-09: 3 mL via INTRAVENOUS

## 2017-06-09 MED ORDER — SODIUM CHLORIDE 0.9 % IV SOLN: 250.0000 mL | INTRAVENOUS | Status: DC | PRN

## 2017-06-09 MED ORDER — ONDANSETRON HCL 4 MG/2ML IJ SOLN
4.0000 mg | Freq: Four times a day (QID) | INTRAMUSCULAR | Status: DC | PRN
  Administered 2017-06-09 – 2017-06-10 (×2): 4 mg via INTRAVENOUS
  Filled 2017-06-09 (×2): qty 2

## 2017-06-09 MED ORDER — ONDANSETRON HCL 4 MG PO TABS: 4.0000 mg | ORAL_TABLET | Freq: Four times a day (QID) | ORAL | Status: DC | PRN

## 2017-06-09 MED ORDER — SENNOSIDES-DOCUSATE SODIUM 8.6-50 MG PO TABS: 1.0000 | ORAL_TABLET | Freq: Every evening | ORAL | Status: DC | PRN

## 2017-06-09 MED ORDER — SIMVASTATIN 20 MG PO TABS: 40.0000 mg | ORAL_TABLET | Freq: Every day | ORAL | Status: DC

## 2017-06-09 MED ORDER — INSULIN ASPART 100 UNIT/ML ~~LOC~~ SOLN: 0.0000 [IU] | Freq: Three times a day (TID) | SUBCUTANEOUS | Status: DC

## 2017-06-09 MED ORDER — HYDRALAZINE HCL 20 MG/ML IJ SOLN
10.0000 mg | INTRAMUSCULAR | Status: DC | PRN
  Administered 2017-06-09 – 2017-06-10 (×3): 10 mg via INTRAVENOUS
  Filled 2017-06-09 (×3): qty 1

## 2017-06-09 NOTE — ED Triage Notes (Signed)
Pt arrived via EMS from home with reports of fainting at home.  On arrival pt had heart rate in the 30s and 40s. Pt was alert but diaphoretic.  No neuro deficits per EMS.  Pt talking on arrival.  Pt was given 1mg  of Atropine with EMS and heart rate up in the 70s.

## 2017-06-09 NOTE — ED Provider Notes (Addendum)
[facility] Emergency Department Provider Note  ____________________________________________   I have reviewed the triage vital signs and the nursing notes. Where available I have reviewed prior notes and, if possible and indicated, outside hospital notes.    HISTORY  Chief Complaint Loss of Consciousness and Bradycardia    HPI Albert Harrison is a 84 y.o. male  Today complaining of feeling lightheaded.  EMS was called out because he was feeling lightheaded.  He was sitting around the house doing nothing.  He states that he became lightheaded and dizzy.  EMS found him with a heart rate in the 40s, but blood pressure was elevated, there was no trauma he did not pass out but he was somewhat difficult to arouse initially.  They gave him atropine and he seemed to wake up better.  Patient has no complaints of chest pain shortness of breath nausea vomiting or headache.  He denies any fall.  He has had nothing like this before.  He is not on any beta-blockers.  He does take losartan and as needed Lasix.  Patient has a known right bundle branch block and a known first-degree heart block.  Patient has had no vomiting or diarrhea.  He has been feeling otherwise well.   Past Medical History:  Diagnosis Date  . Abdominal pain   . BPH (benign prostatic hypertrophy) with urinary obstruction   . Diabetes ([place])   . Hyperlipemia   . Hypertension   . Renal cyst   . Renal stone     Patient Active Problem List   Diagnosis Date Noted  . Absolute anemia 10/26/2014  . Difficulty hearing 10/26/2014  . HLD (hyperlipidemia) 10/26/2014  . BP (high blood pressure) 07/22/2013    Past Surgical History:  Procedure Laterality Date  . HERNIA REPAIR      Prior to Admission medications   Medication Sig Start Date End Date Taking? Authorizing Provider  Coenzyme Q10 200 MG capsule Take by mouth.    [provider]  finasteride (PROSCAR) 5 MG tablet Take 1 tablet (5 mg total) by  mouth daily. 09/17/14   Collier Flowers, MD  furosemide (LASIX) 20 MG tablet Take by mouth. 08/06/14 08/06/15  [provider]  hydrochlorothiazide (HYDRODIURIL) 25 MG tablet Take by mouth. 08/03/14   [provider]  ibuprofen (ADVIL,MOTRIN) 200 MG tablet Take by mouth.    [provider]  losartan (COZAAR) 50 MG tablet Take by mouth. 08/03/14   [provider]  simvastatin (ZOCOR) 40 MG tablet Take by mouth. 08/03/14   [provider]  tamsulosin (FLOMAX) 0.4 MG CAPS capsule Take by mouth.    [provider]  tamsulosin (FLOMAX) 0.4 MG CAPS capsule Take 1 capsule (0.4 mg total) by mouth daily. Patient not taking: Reported on 10/26/2014 09/17/14   Collier Flowers, MD    Allergies Morphine  History reviewed. No pertinent family history.  Social History Social History   Tobacco Use  . Smoking status: Never Smoker  . Smokeless tobacco: Never Used  Substance Use Topics  . Alcohol use: No    Alcohol/week: 0.0 oz  . Drug use: No    Review of Systems Constitutional: No fever/chills Eyes: No visual changes. ENT: No sore throat. No stiff neck no neck pain Cardiovascular: Denies chest pain. Respiratory: Denies shortness of breath. Gastrointestinal:   no vomiting.  No diarrhea.  No constipation. Genitourinary: Negative for dysuria. Musculoskeletal: Negative lower extremity swelling Skin: Negative for rash. Neurological: Negative for severe headaches,  focal weakness or numbness.   ____________________________________________   PHYSICAL EXAM:  VITAL SIGNS: ED Triage Vitals  Enc Vitals Group     BP 06/09/17 1433 (!) 202/80     Pulse Rate 06/09/17 1433 62     Resp 06/09/17 1433 18     Temp --      Temp src --      SpO2 06/09/17 1433 96 %     Weight 06/09/17 1439 180 lb (81.6 kg)     Height 06/09/17 1439 5\' 9"  (1.753 m)     Head Circumference --      Peak Flow --      Pain Score 06/09/17 1437 0     Pain Loc --      Pain Edu?  --      Excl. in [place]? --     Constitutional: Alert and oriented. Well appearing and in no acute distress. Eyes: Conjunctivae are normal Head: Atraumatic HEENT: No congestion/rhinnorhea. Mucous membranes are moist.  Oropharynx non-erythematous Neck:   Nontender with no meningismus, no masses, no stridor Cardiovascular: Normal rate, regular rhythm. Grossly normal heart sounds.  Good peripheral circulation. Respiratory: Normal respiratory effort.  No retractions. Lungs CTAB. Abdominal: Soft and nontender. No distention. No guarding no rebound Back:  There is no focal tenderness or step off.  there is no midline tenderness there are no lesions noted. there is no CVA tenderness Musculoskeletal: No lower extremity tenderness, no upper extremity tenderness. No joint effusions, no DVT signs strong distal pulses no edema Neurologic:  Normal speech and language. No gross focal neurologic deficits are appreciated.  Skin:  Skin is warm, dry and intact. No rash noted. Psychiatric: Mood and affect are normal. Speech and behavior are normal.  ____________________________________________   LABS (all labs ordered are listed, but only abnormal results are displayed)  Labs Reviewed  CBC WITH DIFFERENTIAL/PLATELET  PROTIME-INR  APTT  TROPONIN I  BASIC METABOLIC PANEL  URINALYSIS, COMPLETE (UACMP) WITH MICROSCOPIC  MAGNESIUM    Pertinent labs  results that were available during my care of the patient were reviewed by me and considered in my medical decision making (see chart for details). ____________________________________________  EKG  I personally interpreted any EKGs ordered by me or triage Sinus rhythm, rate 70 bpm, bradycardia noted, prolonged PR interval noted, LVH, right bundle branch block which is old, no acute change from 2014 ____________________________________________  RADIOLOGY  Pertinent labs & imaging results that were available during my care of the patient were reviewed by  me and considered in my medical decision making (see chart for details). If possible, patient and/or family made aware of any abnormal findings.  No results found. ____________________________________________    PROCEDURES  Procedure(s) performed: None  Procedures  Critical Care performed: CRITICAL CARE Performed by: Schuyler Amor   Total critical care time: 45 minutes  Critical care time was exclusive of separately billable procedures and treating other patients.  Critical care was necessary to treat or prevent imminent or life-threatening deterioration.  Critical care was time spent personally by me on the following activities: development of treatment plan with patient and/or surrogate as well as nursing, discussions with consultants, evaluation of patient's response to treatment, examination of patient, obtaining history from patient or surrogate, ordering and performing treatments and interventions, ordering and review of laboratory studies, ordering and review of radiographic studies, pulse oximetry and re-evaluation of patient's condition.   ____________________________________________   INITIAL IMPRESSION / ASSESSMENT AND PLAN / ED COURSE  Pertinent  labs & imaging results that were available during my care of the patient were reviewed by me and considered in my medical decision making (see chart for details).  Patient here with presyncopal symptoms and bradycardia, blood pressure is elevated, is unclear if there is a connection between the heart rate and his presyncopal symptoms or something else going on.  Given elevated blood pressure and low heart rate I will obtain a CT scan of the head although I have low suspicion for cushingoid, patient is neurologically intact.  Patient blood work is pending at this time he will require admission to the hospital we have placed cardiac leads on him, etiology for his presentation is not yet  determined.  ----------------------------------------- 3:44 PM on 06/09/2017 -----------------------------------------  CT to my read is negative, Dr. Serafina Royals and I discussed the patient's care in the phone.  He, cardiology, does not feel there is any other intervention he would do, we discussed about his EKG, heart rate, blood pressure and vital signs otherwise.  Patient is in no acute distress at this time, we will admit for observation after consult cardiology,     ____________________________________________   FINAL CLINICAL IMPRESSION(S) / ED DIAGNOSES  Final diagnoses:  Syncope      This chart was dictated using voice recognition software.  Despite best efforts to proofread,  errors can occur which can change meaning.      Schuyler Amor, MD 06/09/17 1517    Schuyler Amor, MD 06/09/17 1544

## 2017-06-09 NOTE — Progress Notes (Signed)
Advanced care plan. Purpose of the Encounter: CODE STATUS Parties in Attendance:Patient and family Patient's Decision Capacity:Good Subjective/Patient's story: Presented for light headedness and weakness Objective/Medical story Has bradycardia Goals of care determination:  Advance directives were discussed, for now patient and family wants everything done which includes cardiac resuscitation, intubation and ventilator if need arises CODE STATUS: Full code Time spent discussing advanced care planning: 16 minutes

## 2017-06-09 NOTE — ED Notes (Signed)
Pt reports he has been off his BP medication Losartan because of the recall and has not been taking his medication.

## 2017-06-09 NOTE — ED Notes (Addendum)
Pt states he is feeling half-way better, continues to eat ice chips. Pt alert and talking with family.  Denies any needs at this time.

## 2017-06-09 NOTE — ED Notes (Signed)
Dr. Burlene Arnt notified that pt's heart rate in the 60s. Pt assessed patient and spoke with family.

## 2017-06-09 NOTE — ED Notes (Signed)
Spoke with Dr. Estanislado Pandy pt is appropriate to go to telemetry floor and does not meet ICU criteria.  New orders given for BP meds.

## 2017-06-09 NOTE — H&P (Signed)
[facility] [place] NAME: Albert Harrison    MR#:  [id number]  DATE OF BIRTH:  02/20/1925  DATE OF ADMISSION:  06/09/2017  PRIMARY CARE PHYSICIAN: Tracie Harrier, MD   REQUESTING/REFERRING PHYSICIAN:   CHIEF COMPLAINT:   Chief Complaint  Patient presents with  . Loss of Consciousness  . Bradycardia    HISTORY OF PRESENT ILLNESS: Albert Harrison  is a 84 y.o. male with a known history of known prostate hypertrophy diabetes mellitus type 2 hyperlipidemia hypertension presented from home.  Patient feels weak and dizzy and lightheaded and felt as if he was about to pass out.  No loss of consciousness no history of any witnessed seizure. When patient presented to the emergency room his heart rate was low around 30 bpm.  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  Patient was worked up with CT head and his heart rate later on improved in the emergency room.  No complaints of any chest pain, shortness of breath hospitalist service was consulted for further care.  PAST MEDICAL HISTORY:   Past Medical History:  Diagnosis Date  . Abdominal pain   . BPH (benign prostatic hypertrophy) with urinary obstruction   . Diabetes ([place])   . Hyperlipemia   . Hypertension   . Renal cyst   . Renal stone     PAST SURGICAL HISTORY:  Past Surgical History:  Procedure Laterality Date  . HERNIA REPAIR      SOCIAL HISTORY:  Social History   Tobacco Use  . Smoking status: Never Smoker  . Smokeless tobacco: Never Used  Substance Use Topics  . Alcohol use: No    Alcohol/week: 0.0 oz    FAMILY HISTORY: History reviewed. No pertinent family history.  DRUG ALLERGIES:  Allergies  Allergen Reactions  . Morphine Other (See Comments)    REVIEW OF SYSTEMS:   CONSTITUTIONAL: No fever, has fatigue and weakness.  EYES: No blurred or double vision.  EARS, NOSE, AND THROAT: No tinnitus or ear pain.  RESPIRATORY: No cough, shortness of breath,  wheezing or hemoptysis.  CARDIOVASCULAR: No chest pain, orthopnea, edema.  GASTROINTESTINAL: No nausea, vomiting, diarrhea or abdominal pain.  GENITOURINARY: No dysuria, hematuria.  ENDOCRINE: No polyuria, nocturia,  HEMATOLOGY: No anemia, easy bruising or bleeding SKIN: No rash or lesion. MUSCULOSKELETAL: No joint pain or arthritis.   NEUROLOGIC: No tingling, numbness, weakness.   Has dizziness PSYCHIATRY: No anxiety or depression.   MEDICATIONS AT HOME:  Prior to Admission medications   Medication Sig Start Date End Date Taking? Authorizing Provider  Coenzyme Q10 200 MG capsule Take 200 mg by mouth daily.    Yes [provider]  furosemide (LASIX) 20 MG tablet Take 20 mg by mouth daily as needed for fluid.  08/06/14 06/10/18 Yes [provider]  finasteride (PROSCAR) 5 MG tablet Take 1 tablet (5 mg total) by mouth daily. Patient not taking: Reported on 06/09/2017 09/17/14   Collier Flowers, MD  tamsulosin (FLOMAX) 0.4 MG CAPS capsule Take 1 capsule (0.4 mg total) by mouth daily. Patient not taking: Reported on 10/26/2014 09/17/14   Collier Flowers, MD      PHYSICAL EXAMINATION:   VITAL SIGNS: Blood pressure (!) 188/110, pulse (!) 52, resp. rate 16, height 5\' 9"  (1.753 m), weight 81.6 kg (180 lb), SpO2 94 %.  GENERAL:  84 y.o.-year-old patient lying in the bed with no acute distress.  EYES: Pupils equal, round, reactive to light  and accommodation. No scleral icterus. Extraocular muscles intact.  HEENT: Head atraumatic, normocephalic. Oropharynx and nasopharynx clear.  NECK:  Supple, no jugular venous distention. No thyroid enlargement, no tenderness.  LUNGS: Normal breath sounds bilaterally, no wheezing, rales,rhonchi or crepitation. No use of accessory muscles of respiration.  CARDIOVASCULAR: S1, S2 bradycardia. No murmurs, rubs, or gallops.  ABDOMEN: Soft, nontender, nondistended. Bowel sounds present. No organomegaly or mass.  EXTREMITIES: No pedal edema, cyanosis, or  clubbing.  NEUROLOGIC: Cranial nerves II through XII are intact. Muscle strength 5/5 in all extremities. Sensation intact. Gait not checked.  PSYCHIATRIC: The patient is alert and oriented x 3.  SKIN: No obvious rash, lesion, or ulcer.   LABORATORY PANEL:   CBC Recent Labs  Lab 06/09/17 1442  WBC 7.1  HGB 17.0  HCT 49.8  PLT 122*  MCV 90.2  MCH 30.7  MCHC 34.1  RDW 14.1  LYMPHSABS 0.9*  MONOABS 0.5  EOSABS 0.0  BASOSABS 0.0   ------------------------------------------------------------------------------------------------------------------  Chemistries  Recent Labs  Lab 06/09/17 1442  NA 141  K 3.4*  CL 102  CO2 26  GLUCOSE 171*  BUN 21*  CREATININE 1.14  CALCIUM 9.0  MG 1.9   ------------------------------------------------------------------------------------------------------------------ estimated creatinine clearance is 43.9 mL/min (by C-G formula based on SCr of 1.14 mg/dL). ------------------------------------------------------------------------------------------------------------------ No results for input(s): TSH, T4TOTAL, T3FREE, THYROIDAB in the last 72 hours.  Invalid input(s): FREET3   Coagulation profile Recent Labs  Lab 06/09/17 1442  INR 0.95   ------------------------------------------------------------------------------------------------------------------- No results for input(s): DDIMER in the last 72 hours. -------------------------------------------------------------------------------------------------------------------  Cardiac Enzymes Recent Labs  Lab 06/09/17 1442  TROPONINI <0.03   ------------------------------------------------------------------------------------------------------------------ Invalid input(s): POCBNP  ---------------------------------------------------------------------------------------------------------------  Urinalysis    Component Value Date/Time   COLORURINE STRAW (A) 06/09/2017 1443   APPEARANCEUR  CLEAR (A) 06/09/2017 1443   APPEARANCEUR Clear 10/26/2014 1346   LABSPEC 1.006 06/09/2017 1443   LABSPEC 1.020 11/16/2013 0945   PHURINE 6.0 06/09/2017 1443   GLUCOSEU NEGATIVE 06/09/2017 1443   GLUCOSEU Negative 11/16/2013 0945   HGBUR NEGATIVE 06/09/2017 1443   BILIRUBINUR NEGATIVE 06/09/2017 1443   BILIRUBINUR Negative 10/26/2014 1346   BILIRUBINUR Negative 11/16/2013 0945   KETONESUR NEGATIVE 06/09/2017 1443   PROTEINUR NEGATIVE 06/09/2017 1443   NITRITE NEGATIVE 06/09/2017 1443   LEUKOCYTESUR NEGATIVE 06/09/2017 1443   LEUKOCYTESUR Negative 10/26/2014 1346   LEUKOCYTESUR Negative 11/16/2013 0945     RADIOLOGY: Ct Head Wo Contrast  Result Date: 06/09/2017 CLINICAL DATA:  Recent syncopal episode EXAM: CT HEAD WITHOUT CONTRAST TECHNIQUE: Contiguous axial images were obtained from the base of the skull through the vertex without intravenous contrast. COMPARISON:  None. FINDINGS: Brain: No evidence of acute infarction, hemorrhage, hydrocephalus, extra-axial collection or mass lesion/mass effect. Vascular: No hyperdense vessel or unexpected calcification. Skull: Normal. Negative for fracture or focal lesion. Sinuses/Orbits: No acute finding. Other: None. IMPRESSION: Normal head CT Electronically Signed   By: Inez Catalina M.D.   On: 06/09/2017 15:42   Dg Chest Port 1 View  Result Date: 06/09/2017 CLINICAL DATA:  Recent syncopal episode EXAM: PORTABLE CHEST 1 VIEW COMPARISON:  None. FINDINGS: The heart size and mediastinal contours are within normal limits. Both lungs are clear. The visualized skeletal structures are unremarkable. IMPRESSION: No active disease. Electronically Signed   By: Inez Catalina M.D.   On: 06/09/2017 15:16    EKG: Orders placed or performed during the hospital encounter of 06/09/17  . EKG 12-Lead  . EKG 12-Lead  . ED EKG  . ED EKG  IMPRESSION AND PLAN:  84 year old male patient with history of hypertension, hyperlipidemia, benign prostate hypertrophy,  diabetes mellitus type 2 presented to the emergency room for weakness and dizziness.  -Symptomatic bradycardia Admit patient to telemetry Monitor heart rate Cardiology consultation   -Presyncope Check troponin to rule out ischemia Check echocardiogram to assess LV function  -Hypokalemia Replace potassium orally  -Hypertension Uncontrolled Optimize blood pressures medications  -DVT prophylaxis subcu Lovenox daily   - All the records are reviewed and case discussed with ED provider. Management plans discussed with the patient, family and they are in agreement.  CODE STATUS:Full code Advance Directive Documentation     Most Recent Value  Type of Advance Directive  Healthcare Power of Attorney, Living will  Pre-existing out of facility DNR order (yellow form or pink MOST form)  -  "MOST" Form in Place?  -       TOTAL TIME TAKING CARE OF THIS PATIENT: 51 minutes.    Saundra Shelling M.D on 06/09/2017 at 4:05 PM  Between 7am to 6pm - Pager - [phone]  After 6pm go to [email or web address] - password [email or web address] [facility]  Office  [phone]  CC: Primary care physician; Tracie Harrier, MD

## 2017-06-10 ENCOUNTER — Observation Stay
Admit: 2017-06-10 | Discharge: 2017-06-10 | Disposition: A | Discharge status: Home / Self Care | Payer: Medicare Other | Attending: Internal Medicine | Admitting: Internal Medicine

## 2017-06-10 LAB — CBC
HCT: 47.1 % (ref 40.0–52.0)
Hemoglobin: 16.4 g/dL (ref 13.0–18.0)
MCH: 31.1 pg (ref 26.0–34.0)
MCHC: 34.8 g/dL (ref 32.0–36.0)
MCV: 89.4 fL (ref 80.0–100.0)
PLATELETS: 134 10*3/uL — AB (ref 150–440)
RBC: 5.27 MIL/uL (ref 4.40–5.90)
RDW: 14 % (ref 11.5–14.5)
WBC: 10.1 10*3/uL (ref 3.8–10.6)

## 2017-06-10 LAB — BASIC METABOLIC PANEL
Anion gap: 9 (ref 5–15)
BUN: 20 mg/dL (ref 6–20)
CALCIUM: 8.8 mg/dL — AB (ref 8.9–10.3)
CO2: 29 mmol/L (ref 22–32)
CREATININE: 1.21 mg/dL (ref 0.61–1.24)
Chloride: 103 mmol/L (ref 101–111)
GFR calc Af Amer: 59 mL/min — ABNORMAL LOW (ref >60)
GFR calc non Af Amer: 51 mL/min — ABNORMAL LOW (ref >60)
GLUCOSE: 140 mg/dL — AB (ref 65–99)
Potassium: 3.7 mmol/L (ref 3.5–5.1)
Sodium: 141 mmol/L (ref 135–145)

## 2017-06-10 LAB — GLUCOSE, CAPILLARY
GLUCOSE-CAPILLARY: 108 mg/dL — AB (ref 65–99)
Glucose-Capillary: 118 mg/dL — ABNORMAL HIGH (ref 65–99)
Glucose-Capillary: 113 mg/dL — ABNORMAL HIGH (ref 65–99)

## 2017-06-10 LAB — ECHOCARDIOGRAM COMPLETE
Height: 68 in
WEIGHTICAEL: 2774.4 [oz_av]

## 2017-06-10 LAB — TROPONIN I

## 2017-06-10 LAB — HEMOGLOBIN A1C
HEMOGLOBIN A1C: 5.5 % (ref 4.8–5.6)
MEAN PLASMA GLUCOSE: 111.15 mg/dL

## 2017-06-10 MED ORDER — NITROGLYCERIN 0.4 MG SL SUBL
0.4000 mg | SUBLINGUAL_TABLET | SUBLINGUAL | Status: DC | PRN
  Administered 2017-06-10 (×2): 0.4 mg via SUBLINGUAL

## 2017-06-10 MED ORDER — HYDRALAZINE HCL 25 MG PO TABS: 25.0000 mg | ORAL_TABLET | Freq: Three times a day (TID) | ORAL | 0 refills | Status: DC

## 2017-06-10 MED ORDER — NITROGLYCERIN 0.4 MG SL SUBL
SUBLINGUAL_TABLET | SUBLINGUAL | Status: AC
  Administered 2017-06-10: 02:00:00
  Filled 2017-06-10: qty 1

## 2017-06-10 NOTE — Discharge Summary (Signed)
Albert Harrison, is a 84 y.o. male  DOB 02/07/1925  MRN [id number].  Admission date:  06/09/2017  Admitting Physician  Saundra Shelling, MD  Discharge Date:  06/10/2017   Primary MD  Tracie Harrier, MD  Recommendations for primary care physician for things to follow:   Follow-up with PCP in 1 week regarding his blood pressure, medication adjustment   Admission Diagnosis  Bradycardia [R00.1] Syncope [R55]   Discharge Diagnosis  Bradycardia [R00.1] Syncope [R55]   Active Problems:   Bradycardia      Past Medical History:  Diagnosis Date  . Abdominal pain   . BPH (benign prostatic hypertrophy) with urinary obstruction   . Diabetes ([place])   . Hyperlipemia   . Hypertension   . Renal cyst   . Renal stone     Past Surgical History:  Procedure Laterality Date  . HERNIA REPAIR         History of present illness and  Hospital Course:     Kindly see H&P for history of present illness and admission details, please review complete Labs, Consult reports and Test reports for all details in brief  HPI  from the history and physical done on the day of admission 84 year old male patient with history of BPH, hypertension came in because of dizziness, near syncopal event found to have bradycardia with heart rate around 40 bpm.   Hospital Course  #1 near syncopal event secondary to dizziness, bradycardia: Patient heart rate around 40 bpm on admission admitted to telemetry for evaluation of cardiac arrhythmia.  Patient found to have Mobitz type I heart block, patient did not have any evidence of second-degree heart block.  Patient troponins have been negative for 3 times.  Seen by cardiology Nehemiah Massed recommended echocardiogram.  Patient echocardiogram showed EF more than 50%.  Patient did not have advanced heart block.  Heart rate  stable around 58 on telemetry.  Patient seen by Dr. Nehemiah Massed recommended no further cardiac work-up except echocardiogram and ambulate the patient and discharge.  Patient ambulated well, physical therapy did not recommend any physical therapy needs.  Told the patient if he feels dizzy and if has persistent bradycardia patient needs Holter monitor and possible evaluation for a pacemaker.  Also discussed with patient's sons at bedside. 2.  Essential hypertension: Controlled.  Patient was taken off losartan due to recall recently.  Patient takes Lasix 20 mg daily, added hydralazine 25 mg p.o. 3 times daily this admission secondary to elevated blood pressure, systolic 732/20. 3.  Hypokalemia: Replaced the potassium.     Discharge Condition: Stable   Follow UP  Follow-up Information    Tracie Harrier, MD. Schedule an appointment as soon as possible for a visit on 06/17/2017.   Specialty:  Internal Medicine Why:  Appointment Time: 9a Contact information: [place] [address] [phone]        Corey Skains, MD. Schedule an appointment as soon as possible for a visit on 06/19/2017.   Specialty:  Cardiology Why:  Appointment Time: 3:15p Contact information: [address] [facility] [place] [address] [phone]             Discharge Instructions  and  Discharge Medications      Allergies as of 06/10/2017      Reactions   Morphine Other (See Comments)      Medication List    TAKE these medications   Coenzyme Q10 200 MG capsule Take 200 mg by mouth daily.  finasteride 5 MG tablet Commonly known as:  PROSCAR Take 1 tablet (5 mg total) by mouth daily.   furosemide 20 MG tablet Commonly known as:  LASIX Take 20 mg by mouth daily as needed for fluid.   hydrALAZINE 25 MG tablet Commonly known as:  APRESOLINE Take 1 tablet (25 mg total) by mouth 3 (three) times daily.   tamsulosin 0.4 MG Caps  capsule Commonly known as:  FLOMAX Take 1 capsule (0.4 mg total) by mouth daily.         Diet and Activity recommendation: See Discharge Instructions above   Consults obtained - cardiology   Major procedures and Radiology Reports - PLEASE review detailed and final reports for all details, in brief -     Ct Head Wo Contrast  Result Date: 06/09/2017 CLINICAL DATA:  Recent syncopal episode EXAM: CT HEAD WITHOUT CONTRAST TECHNIQUE: Contiguous axial images were obtained from the base of the skull through the vertex without intravenous contrast. COMPARISON:  None. FINDINGS: Brain: No evidence of acute infarction, hemorrhage, hydrocephalus, extra-axial collection or mass lesion/mass effect. Vascular: No hyperdense vessel or unexpected calcification. Skull: Normal. Negative for fracture or focal lesion. Sinuses/Orbits: No acute finding. Other: None. IMPRESSION: Normal head CT Electronically Signed   By: Inez Catalina M.D.   On: 06/09/2017 15:42   Dg Chest Port 1 View  Result Date: 06/09/2017 CLINICAL DATA:  Recent syncopal episode EXAM: PORTABLE CHEST 1 VIEW COMPARISON:  None. FINDINGS: The heart size and mediastinal contours are within normal limits. Both lungs are clear. The visualized skeletal structures are unremarkable. IMPRESSION: No active disease. Electronically Signed   By: Inez Catalina M.D.   On: 06/09/2017 15:16    Micro Results     No results found for this or any previous visit (from the past 240 hour(s)).     Today   Subjective:   Raynard Mapps today has no chest pain or dizziness.  Stable for discharge.  Discussed the plan with patient's sons.  Objective:   Blood pressure (!) 181/67, pulse (!) 58, temperature 97.6 F (36.4 C), resp. rate 18, height 5\' 8"  (1.727 m), weight 78.7 kg (173 lb 6.4 oz), SpO2 96 %.   Intake/Output Summary (Last 24 hours) at 06/10/2017 1657 Last data filed at 06/10/2017 1348 Gross per 24 hour  Intake 240 ml  Output 125 ml  Net 115 ml     Exam Awake Alert, Oriented x 3, No new F.N deficits, Normal affect [place].AT,PERRAL Supple Neck,No JVD, No cervical lymphadenopathy appriciated.  Symmetrical Chest wall movement, Good air movement bilaterally, CTAB RRR,No Gallops,Rubs or new Murmurs, No Parasternal Heave +ve B.Sounds, Abd Soft, Non tender, No organomegaly appriciated, No rebound -guarding or rigidity. No Cyanosis, Clubbing or edema, No new Rash or bruise  Data Review   CBC w Diff:  Lab Results  Component Value Date   WBC 10.1 06/10/2017   HGB 16.4 06/10/2017   HGB 14.6 11/16/2013   HCT 47.1 06/10/2017   HCT 45.4 11/16/2013   PLT 134 (L) 06/10/2017   PLT 146 (L) 11/16/2013   LYMPHOPCT 13 06/09/2017   LYMPHOPCT 7.1 11/16/2013   MONOPCT 7 06/09/2017   MONOPCT 9.5 11/16/2013   EOSPCT 1 06/09/2017   EOSPCT 0.2 11/16/2013   BASOPCT 0 06/09/2017   BASOPCT 0.1 11/16/2013    CMP:  Lab Results  Component Value Date   NA 141 06/10/2017   NA 143 11/16/2013   K 3.7 06/10/2017   K 4.0 11/16/2013   CL 103  06/10/2017   CL 103 11/16/2013   CO2 29 06/10/2017   CO2 31 11/16/2013   BUN 20 06/10/2017   BUN 26 (H) 11/16/2013   CREATININE 1.21 06/10/2017   CREATININE 1.74 (H) 11/16/2013   PROT 6.9 11/16/2013   ALBUMIN 3.8 11/16/2013   BILITOT 0.8 11/16/2013   ALKPHOS 88 11/16/2013   AST 23 11/16/2013   ALT 24 11/16/2013  .   Total Time in preparing paper work, data evaluation and todays exam - 7 minutes  Epifanio Lesches M.D on 06/10/2017 at 4:57 PM    Note: This dictation was prepared with Dragon dictation along with smaller phrase technology. Any transcriptional errors that result from this process are unintentional.

## 2017-06-10 NOTE — Progress Notes (Addendum)
84-year-old male patient admitted because of dizziness, nausea, syncopal event and found to have bradycardia.  Patient bradycardia resolved and he feels much better.  Today ambulate the patient and see if the symptoms come back.  I feel that patient had vasovagal syncope when he had nausea, vomiting likely had vagal nerve stimulation patient.  Getting echo done, follow the results.,  And is in sinus rhythm, heart rate 61 bpm.  Discussed with family about his blood pressure medicine, patient takes Lasix every day instead of as needed.  Losartan has been stopped.  Patient can follow-up with his PCP Dr. Ginette Pitman 1 week Echocardiogram results reviewed, EF more than 60%

## 2017-06-10 NOTE — Care Management Obs Status (Signed)
[facility]   Patient Details  Name: Albert Harrison MRN: [id number] Date of Birth: Oct 29, 1925   Medicare Observation Status Notification Given:  Yes    Katrina Stack, RN 06/10/2017, 9:12 AM

## 2017-06-10 NOTE — Progress Notes (Signed)
PT Cancellation Note  Patient Details Name: Albert Harrison MRN: [id number] DOB: November 17, 1925   Cancelled Treatment:    Reason Eval/Treat Not Completed: PT screened, no needs identified, will sign off. PT consult received, chart reviewed. Upon entry into room, pt reported walking nursing loop 2 times w/ RW already today. Pt reports normally walking w/ knee braces, shoes, and no AD. Lives in 1-level home, no steps for entry. Pt agreeable to walking w/ therapy and ambulated 190 feet w/ no AD (walked in yellow hospital socks) w/ HR 58 bpm at rest and 71-78 bpm w/ activity (pt did not report normally using knee braces until end of ambulation). Pt reported no chest pain or any other adverse symptoms during activities. Pt reports being at baseline for ambulation/functional mobility. Pt and pt's two sons report no acute PT needs or concerns. No acute PT needs identified.   Chana Bode, SPT 06/10/17, 2:04 PM

## 2017-06-10 NOTE — Progress Notes (Signed)
Pt c/o  Sharp stabbing chest pain. MD Jannifer Franklin made aware.Total of 3 sublingual nitroglycerin  given and EKG obtained. Pt also having nausea and vomiting. Patient states that his pain is now throbbing at 3. Will continue to monitor.

## 2017-06-10 NOTE — Consult Note (Signed)
[name]  Patient ID: Albert Harrison, MRN: [id number], DOB/AGE: 82-Aug-1927 82 y.o. Admit date: 06/09/2017   Date of Consult: 06/10/2017 Primary Physician: Tracie Harrier, MD Primary Cardiologist: None  Chief Complaint:  Chief Complaint  Patient presents with  . Loss of Consciousness  . Bradycardia   Reason for Consult: Bradycardia  HPI: 84 y.o. male with a known borderline hypertension and hyperlipidemia who has never had any cardiovascular disease issues or cardiovascular symptoms.  The patient awakened yesterday morning with significant dizziness and weakness.  The patient with this was having difficulty walking around without presyncope.  The patient did not have any evidence of syncope but still had some concerns with nausea and dizziness so that he was brought to the emergency room.  At that time the patient was seen to have some rhythm disturbance which included first-degree AV block second-degree type I AV block and apparent symptomatic bradycardia at the time.  There were heart rates into the 30 and 40 bpm range but no evidence of advanced heart block or complete heart block.  The patient was given some medication management to hydration and was observed with patient having full resolution of these issues.  Currently the patient does have an EKG showing first-degree AV block with left axis deviation and right bundle branch block.  Troponin levels are normal without evidence of heart failure or anginal equivalent at this time.  The patient is still slightly nauseated but his dizziness has resolved.  Past Medical History:  Diagnosis Date  . Abdominal pain   . BPH (benign prostatic hypertrophy) with urinary obstruction   . Diabetes ([place])   . Hyperlipemia   . Hypertension   . Renal cyst   . Renal stone       Surgical History:  Past Surgical History:  Procedure Laterality Date  . HERNIA REPAIR       Home Meds: Prior to Admission medications    Medication Sig Start Date End Date Taking? Authorizing Provider  Coenzyme Q10 200 MG capsule Take 200 mg by mouth daily.    Yes [provider]  furosemide (LASIX) 20 MG tablet Take 20 mg by mouth daily as needed for fluid.  08/06/14 06/10/18 Yes [provider]  finasteride (PROSCAR) 5 MG tablet Take 1 tablet (5 mg total) by mouth daily. Patient not taking: Reported on 06/09/2017 09/17/14   Collier Flowers, MD  tamsulosin (FLOMAX) 0.4 MG CAPS capsule Take 1 capsule (0.4 mg total) by mouth daily. Patient not taking: Reported on 10/26/2014 09/17/14   Collier Flowers, MD    Inpatient Medications:  . enoxaparin (LOVENOX) injection  40 mg Subcutaneous Q24H  . insulin aspart  0-15 Units Subcutaneous TID WC  . potassium chloride  40 mEq Oral Once  . sodium chloride flush  3 mL Intravenous Q12H  . sodium chloride flush  3 mL Intravenous Q12H   . sodium chloride      Allergies:  Allergies  Allergen Reactions  . Morphine Other (See Comments)    Social History   Socioeconomic History  . Marital status: Married    Spouse name: Not on file  . Number of children: Not on file  . Years of education: Not on file  . Highest education level: Not on file  Occupational History  . Not on file  Social Needs  . Financial resource strain: Not on file  . Food insecurity:    Worry: Not on file    Inability: Not on  file  . Transportation needs:    Medical: Not on file    Non-medical: Not on file  Tobacco Use  . Smoking status: Never Smoker  . Smokeless tobacco: Never Used  Substance and Sexual Activity  . Alcohol use: No    Alcohol/week: 0.0 oz  . Drug use: No  . Sexual activity: Not on file  Lifestyle  . Physical activity:    Days per week: Not on file    Minutes per session: Not on file  . Stress: Not on file  Relationships  . Social connections:    Talks on phone: Not on file    Gets together: Not on file    Attends religious service: Not on file    Active member of  club or organization: Not on file    Attends meetings of clubs or organizations: Not on file    Relationship status: Not on file  . Intimate partner violence:    Fear of current or ex partner: Not on file    Emotionally abused: Not on file    Physically abused: Not on file    Forced sexual activity: Not on file  Other Topics Concern  . Not on file  Social History Narrative  . Not on file     History reviewed. No pertinent family history.   Review of Systems Positive for dizziness nausea Negative for: General:  chills, fever, night sweats or weight changes.  Cardiovascular: PND orthopnea syncope dizziness  Dermatological skin lesions rashes Respiratory: Cough congestion Urologic: Frequent urination urination at night and hematuria Abdominal: negative for  vomiting, diarrhea, bright red blood per rectum, melena, or hematemesis Neurologic: negative for visual changes, and/or hearing changes  All other systems reviewed and are otherwise negative except as noted above.  Labs: Recent Labs    06/09/17 1442 06/09/17 1748 06/09/17 2333 06/10/17 0410  TROPONINI <0.03 <0.03 <0.03 <0.03   Lab Results  Component Value Date   WBC 10.1 06/10/2017   HGB 16.4 06/10/2017   HCT 47.1 06/10/2017   MCV 89.4 06/10/2017   PLT 134 (L) 06/10/2017    Recent Labs  Lab 06/10/17 0410  NA 141  K 3.7  CL 103  CO2 29  BUN 20  CREATININE 1.21  CALCIUM 8.8*  GLUCOSE 140*   No results found for: CHOL, HDL, LDLCALC, TRIG No results found for: DDIMER  Radiology/Studies:  Ct Head Wo Contrast  Result Date: 06/09/2017 CLINICAL DATA:  Recent syncopal episode EXAM: CT HEAD WITHOUT CONTRAST TECHNIQUE: Contiguous axial images were obtained from the base of the skull through the vertex without intravenous contrast. COMPARISON:  None. FINDINGS: Brain: No evidence of acute infarction, hemorrhage, hydrocephalus, extra-axial collection or mass lesion/mass effect. Vascular: No hyperdense vessel or  unexpected calcification. Skull: Normal. Negative for fracture or focal lesion. Sinuses/Orbits: No acute finding. Other: None. IMPRESSION: Normal head CT Electronically Signed   By: Inez Catalina M.D.   On: 06/09/2017 15:42   Dg Chest Port 1 View  Result Date: 06/09/2017 CLINICAL DATA:  Recent syncopal episode EXAM: PORTABLE CHEST 1 VIEW COMPARISON:  None. FINDINGS: The heart size and mediastinal contours are within normal limits. Both lungs are clear. The visualized skeletal structures are unremarkable. IMPRESSION: No active disease. Electronically Signed   By: Inez Catalina M.D.   On: 06/09/2017 15:16    EKG: Normal sinus rhythm with first-degree AV block rhythm left axis deviation and right bundle branch block  Weights: Filed Weights   06/09/17 1439 06/09/17 1746 06/10/17  1749  Weight: 180 lb (81.6 kg) 175 lb 9.6 oz (79.7 kg) 173 lb 6.4 oz (78.7 kg)     Physical Exam: Blood pressure (!) 156/76, pulse 64, temperature 97.6 F (36.4 C), resp. rate 17, height 5\' 8"  (1.727 m), weight 173 lb 6.4 oz (78.7 kg), SpO2 100 %. Body mass index is 26.37 kg/m. General: Well developed, well nourished, in no acute distress. Head eyes ears nose throat: Normocephalic, atraumatic, sclera non-icteric, no xanthomas, nares are without discharge. No apparent thyromegaly and/or mass  Lungs: Normal respiratory effort.  no wheezes, no rales, no rhonchi.  Heart: RRR with normal S1 S2. no murmur gallop, no rub, PMI is normal size and placement, carotid upstroke normal without bruit, jugular venous pressure is normal Abdomen: Soft, non-tender, non-distended with normoactive bowel sounds. No hepatomegaly. No rebound/guarding. No obvious abdominal masses. Abdominal aorta is normal size without bruit Extremities: No edema. no cyanosis, no clubbing, no ulcers  Peripheral : 2+ bilateral upper extremity pulses, 2+ bilateral femoral pulses, 2+ bilateral dorsal pedal pulse Neuro: Alert and oriented. No facial asymmetry. No  focal deficit. Moves all extremities spontaneously. Musculoskeletal: Normal muscle tone without kyphosis Psych:  Responds to questions appropriately with a normal affect.    Assessment: 84 year old male with no previous cardiovascular history having dizziness nausea possibly viral event causing current symptoms and possible symptomatic bradycardia without evidence of significant advanced heart block at this time or heart failure or myocardial infarction  Plan: 1.  Abstain from any medication management that may cause advanced heart block or symptomatic bradycardia 2.  Supportive care for further risk reduction of possible viral gastroenteritis and or other concerns 3.  Begin ambulation and observation of further advanced heart block and/or symptomatic bradycardia further treatment thereof is necessary.  If no further evidence of concerns of heart block may be able to be discharged home with further follow-up next week with possible Holter monitor after possible resolution of viral event 4.  Consideration of hypertension control with other medication management as necessary  Signed, Corey Skains M.D. [facility] 06/10/2017, 7:44 AM

## 2017-06-10 NOTE — Progress Notes (Signed)
*  PRELIMINARY RESULTS* Echocardiogram 2D Echocardiogram has been performed.  Albert Harrison 06/10/2017, 9:21 AM

## 2017-06-10 NOTE — Progress Notes (Signed)
BP has small improvement after med/ MD paged to make aware/ MD informed pt and pts son that he can stay overnight for monitoring via phone/ pt states he wants to go home/ pt walked with RN around nursing station/ tolerated well/ ok per MD that pt is discharged.  Discharge instructions explained to pt and pts sons/ verbalized an understanding/ iv and tele removed/ will transport off unit via wheelchair.

## 2017-06-10 NOTE — Progress Notes (Signed)
Repeat blood pressure is around 170/60.  Patient wanted to go home, given prescription for hydralazine.  We gave the option for the patient to stay overnight for monitoring of the blood pressure while started on new medicine.  But he would rather prefer to go home as per him.  Told family that they can buy a BP machine which can register BP and heart rate and check every day while he is started on hydralazine.

## 2017-06-11 LAB — THYROID PANEL WITH TSH
Free Thyroxine Index: 1.7 (ref 1.2–4.9)
T3 Uptake Ratio: 23 % — ABNORMAL LOW (ref 24–39)
T4 TOTAL: 7.2 ug/dL (ref 4.5–12.0)
TSH: 2.92 u[IU]/mL (ref 0.450–4.500)

## 2019-03-24 IMAGING — CT CT HEAD W/O CM
3 series · 16 of 47 positions shown, 19 images · non-contrast
Comparison: None.

CLINICAL DATA: Recent syncopal episode

EXAM:
CT HEAD WITHOUT CONTRAST
TECHNIQUE: Contiguous axial images were obtained from the base of the skull
through the vertex without intravenous contrast.

[Series 2: head wo · axial · 0.47mm/px · z∈[-133,-3]mm · 10 of 32 slices shown, 13 images]
[im 3/32  brain]
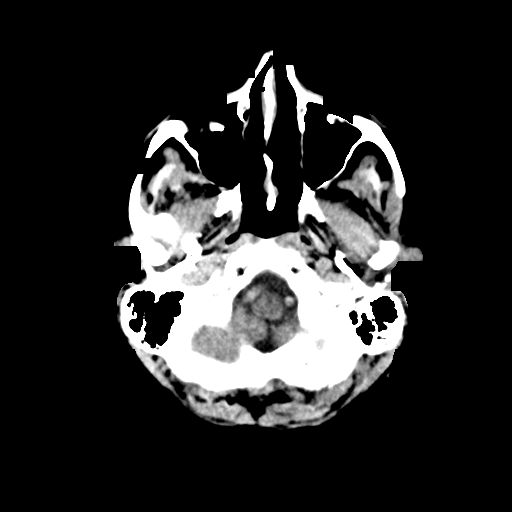
[im 3/32  bone]
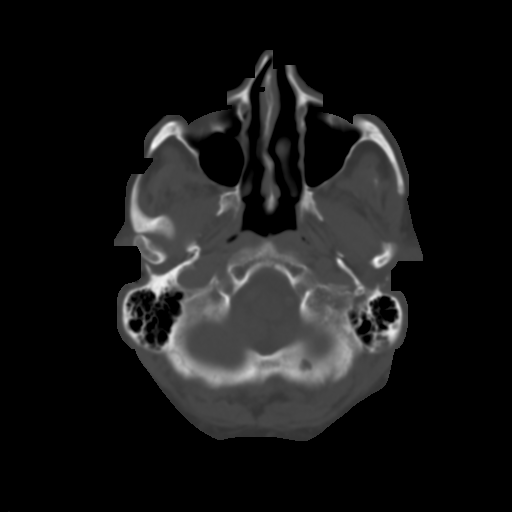
[im 6/32  brain]
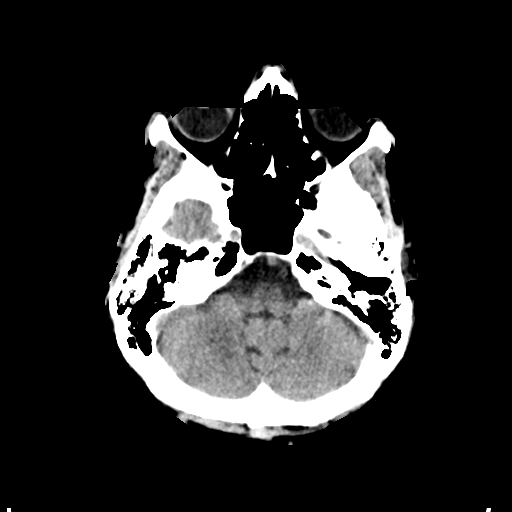
[im 9/32  brain]
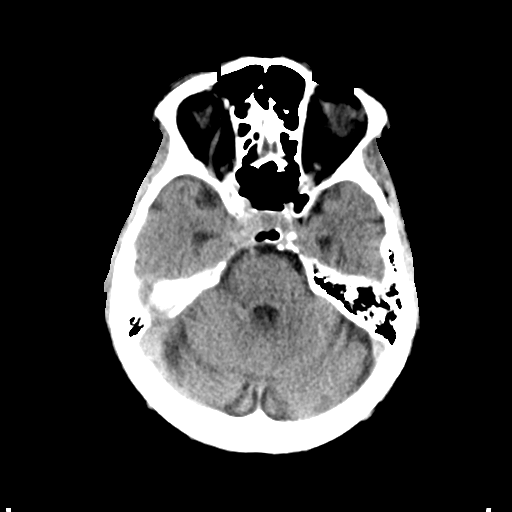
[im 11/32  brain]
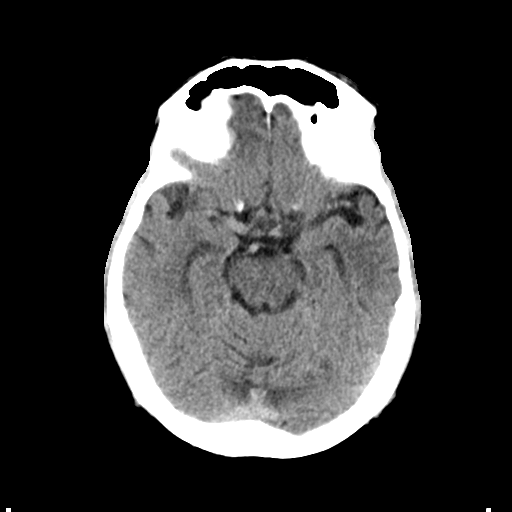
[im 14/32  brain]
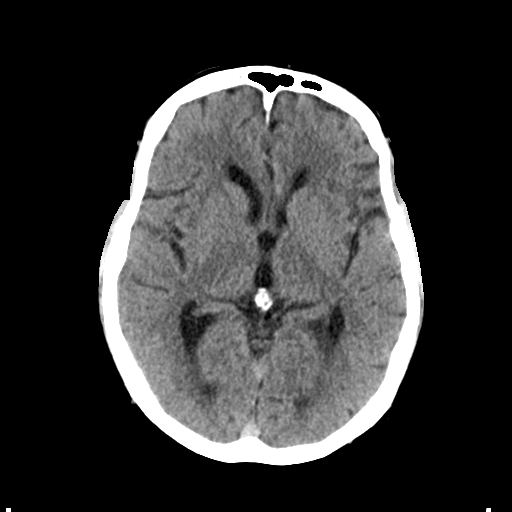
[im 14/32  bone]
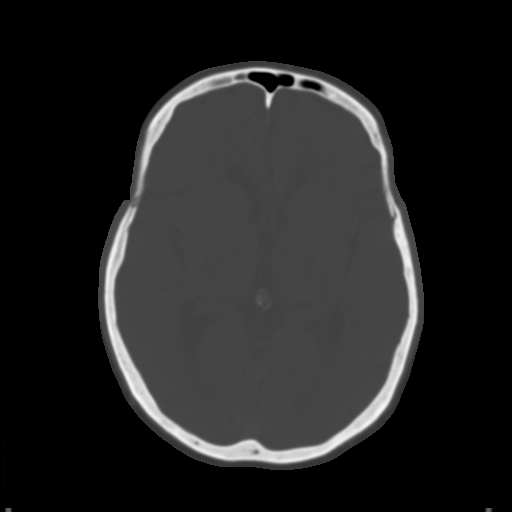
[im 18/32  brain]
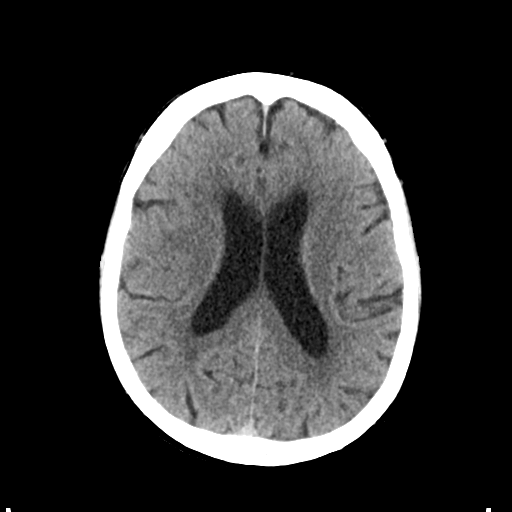
[im 21/32  brain]
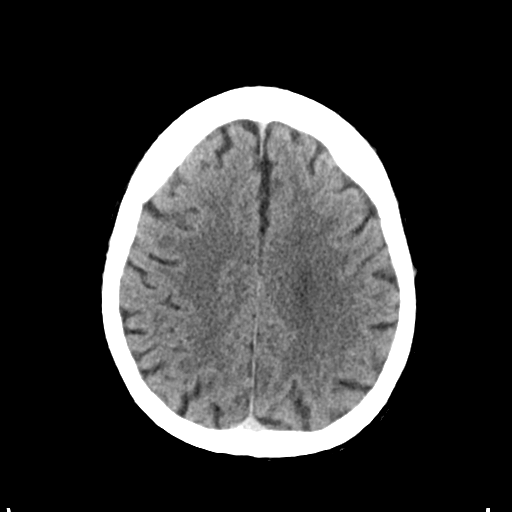
[im 24/32  brain]
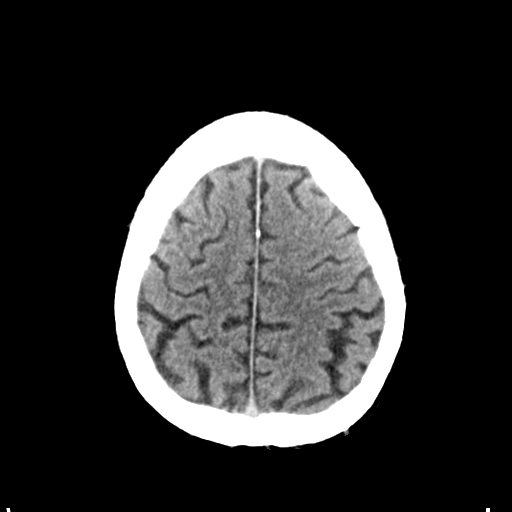
[im 26/32  brain]
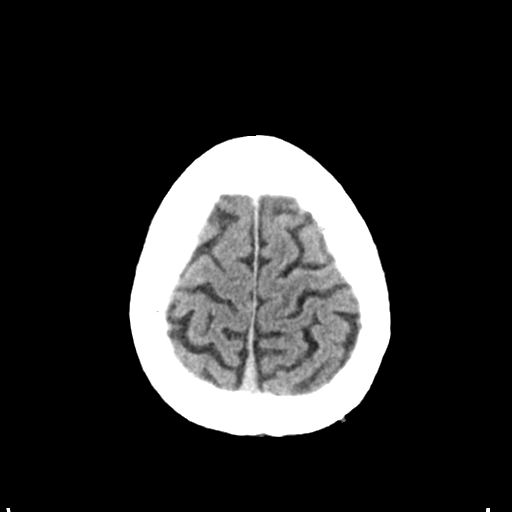
[im 26/32  bone]
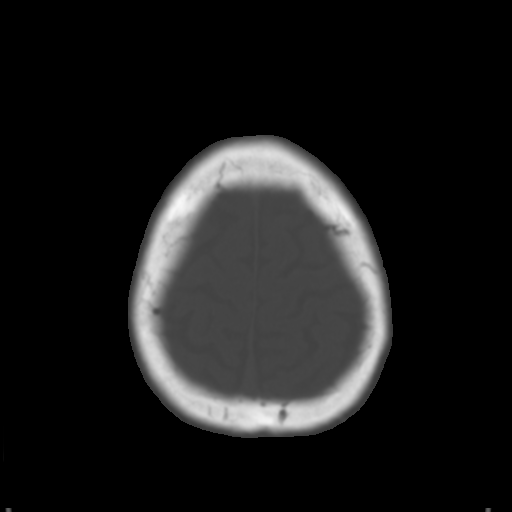
[im 29/32  brain]
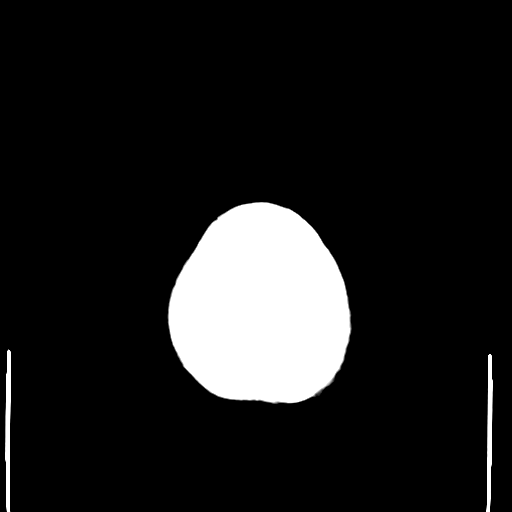

[Series 4: coronal soft tissue · coronal · 0.30mm/px · 3 of 68 slices shown]
[im 23/68  brain]
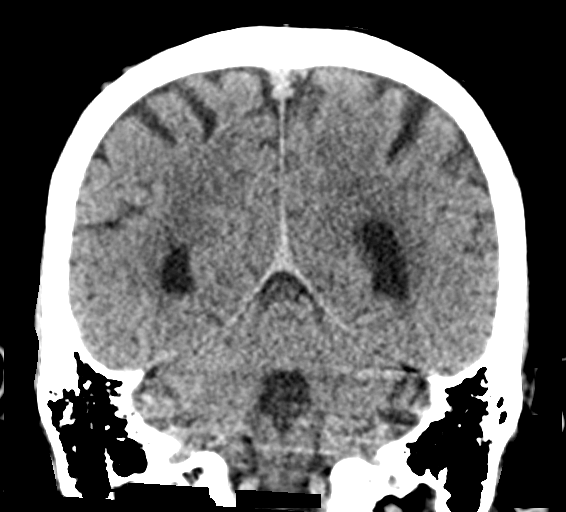
[im 30/68  brain]
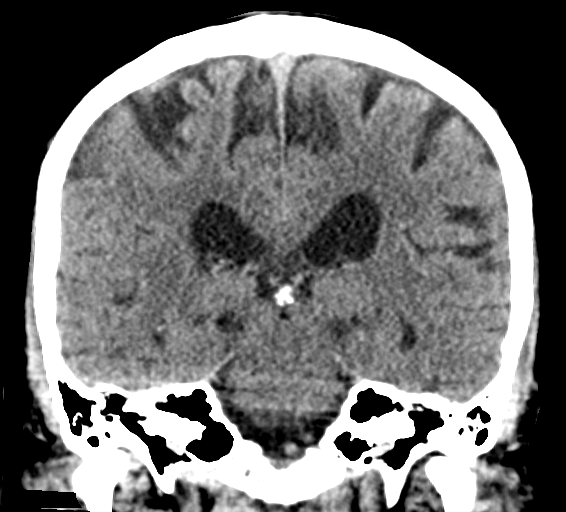
[im 38/68  brain]
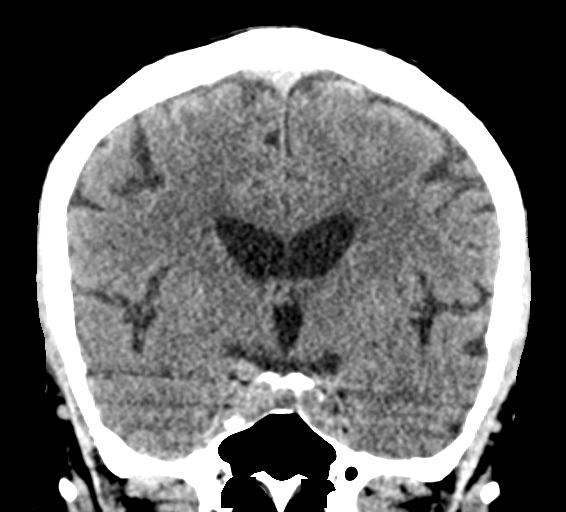

[Series 5: sagittal soft tissue · sagittal · 0.30mm/px · 3 of 54 slices shown]
[im 18/54  brain]
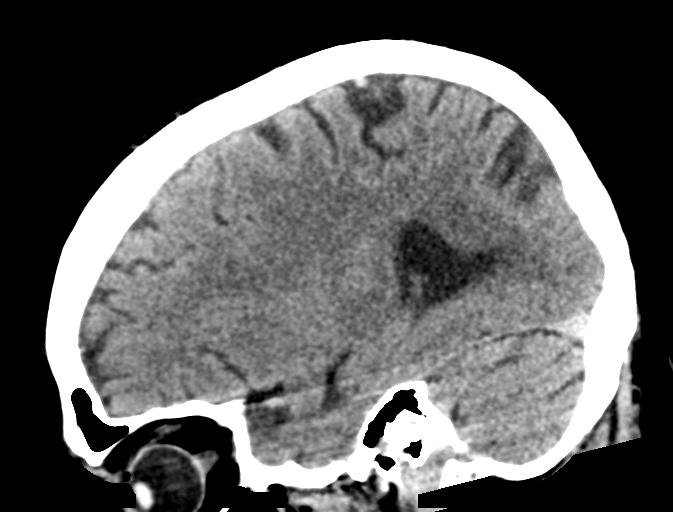
[im 27/54  brain]
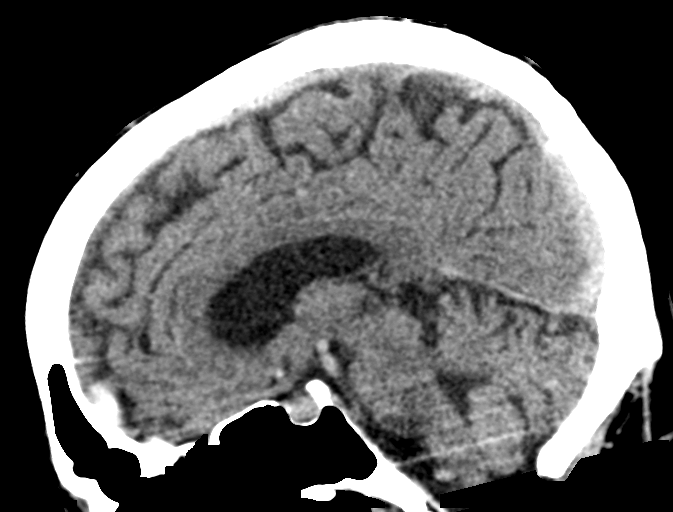
[im 36/54  brain]
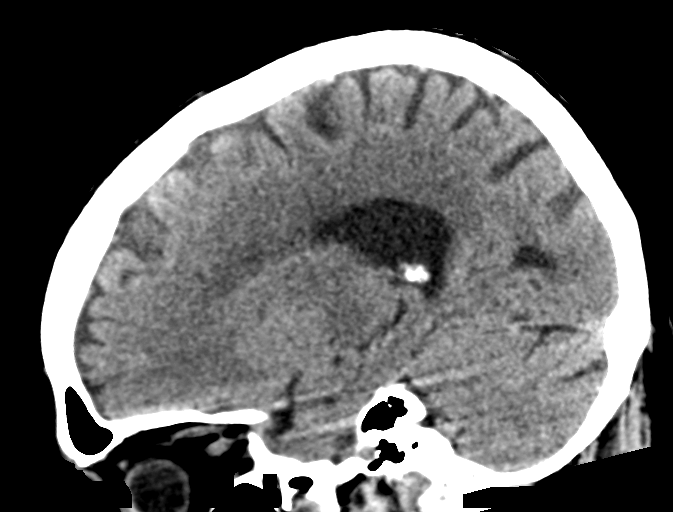

[16 of 47 positions shown; findings below may reference images not displayed]

FINDINGS: Brain: No evidence of acute infarction, hemorrhage, hydrocephalus,
extra-axial collection or mass lesion/mass effect.

Vascular: No hyperdense vessel or unexpected calcification.

Skull: Normal. Negative for fracture or focal lesion.

Sinuses/Orbits: No acute finding.

Other: None.
IMPRESSION: Normal head CT

## 2020-02-16 ENCOUNTER — Ambulatory Visit: Payer: Medicare Other | Admitting: Family Medicine

## 2020-04-15 ENCOUNTER — Ambulatory Visit: Payer: Medicare Other | Admitting: Family Medicine

## 2021-03-16 ENCOUNTER — Ambulatory Visit: Payer: Medicare Other | Admitting: Dermatology

## 2021-03-16 ENCOUNTER — Other Ambulatory Visit: Payer: Self-pay

## 2021-03-16 DIAGNOSIS — L820 Inflamed seborrheic keratosis: Secondary | ICD-10-CM | POA: Diagnosis not present

## 2021-03-16 DIAGNOSIS — L720 Epidermal cyst: Secondary | ICD-10-CM

## 2021-03-16 DIAGNOSIS — L578 Other skin changes due to chronic exposure to nonionizing radiation: Secondary | ICD-10-CM | POA: Diagnosis not present

## 2021-03-16 DIAGNOSIS — Z1283 Encounter for screening for malignant neoplasm of skin: Secondary | ICD-10-CM

## 2021-03-16 DIAGNOSIS — D692 Other nonthrombocytopenic purpura: Secondary | ICD-10-CM | POA: Diagnosis not present

## 2021-03-16 NOTE — Progress Notes (Unsigned)
° °  New Patient Visit  Subjective  Albert Harrison is a 88 y.o. male who presents for the following: Other (New patient - UBSE today - does not recall having any skin cancers).  The patient presents for Upper Body Skin Exam (UBSE) for skin cancer screening and mole check.  The patient has spots, moles and lesions to be evaluated, some may be new or changing and the patient has concerns that these could be cancer.   The following portions of the chart were reviewed this encounter and updated as appropriate:       Review of Systems:  No other skin or systemic complaints except as noted in HPI or Assessment and Plan.  Objective  Well appearing patient in no apparent distress; mood and affect are within normal limits.  All skin waist up examined.  Right upper back 1.5 cm Subcutaneous nodule.     Assessment & Plan   Purpura - Chronic; persistent and recurrent.  Treatable, but not curable. - Violaceous macules and patches - Benign - Related to trauma, age, sun damage and/or use of blood thinners, chronic use of topical and/or oral steroids - Observe - Can use OTC arnica containing moisturizer such as Dermend Bruise Formula if desired - Call for worsening or other concerns   Lentigines - Scattered tan macules - Due to sun exposure - Benign-appearing, observe - Recommend daily broad spectrum sunscreen SPF 30+ to sun-exposed areas, reapply every 2 hours as needed. - Call for any changes  Seborrheic Keratoses - Stuck-on, waxy, tan-brown papules and/or plaques  - Benign-appearing - Discussed benign etiology and prognosis. - Observe - Call for any changes  Melanocytic Nevi - Tan-brown and/or pink-flesh-colored symmetric macules and papules - Benign appearing on exam today - Observation - Call clinic for new or changing moles - Recommend daily use of broad spectrum spf 30+ sunscreen to sun-exposed areas.   Hemangiomas - Red papules - Discussed benign nature - Observe - Call  for any changes  Actinic Damage - Chronic condition, secondary to cumulative UV/sun exposure - diffuse scaly erythematous macules with underlying dyspigmentation - Recommend daily broad spectrum sunscreen SPF 30+ to sun-exposed areas, reapply every 2 hours as needed.  - Staying in the shade or wearing long sleeves, sun glasses (UVA+UVB protection) and wide brim hats (4-inch brim around the entire circumference of the hat) are also recommended for sun protection.  - Call for new or changing lesions.  Skin cancer screening performed today.  Epidermal inclusion cyst Right upper back  Benign   No follow-ups on file.   I, Ashok Cordia, CMA, am acting as scribe for Sarina Ser, MD .

## 2021-03-16 NOTE — Patient Instructions (Addendum)
Can use OTC arnica containing moisturizer such as Dermend Bruise Formula if desired ? ? ?Cryotherapy Aftercare ? ?Wash gently with soap and water everyday.   ?Apply Vaseline and Band-Aid daily until healed.  ? ? ?If You Need Anything After Your Visit ? ?If you have any questions or concerns for your doctor, please call our main line at [phone] and press option 4 to reach your doctor's medical assistant. If no one answers, please leave a voicemail as directed and we will return your call as soon as possible. Messages left after 4 pm will be answered the following business day.  ? ?You may also send [place] a message via MyChart. We typically respond to MyChart messages within 1-2 business days. ? ?For prescription refills, please ask your pharmacy to contact our office. Our fax number is [phone]. ? ?If you have an urgent issue when the clinic is closed that cannot wait until the next business day, you can page your doctor at the number below.   ? ?Please note that while we do our best to be available for urgent issues outside of office hours, we are not available 24/7.  ? ?If you have an urgent issue and are unable to reach [place], you may choose to seek medical care at your doctor's office, retail clinic, urgent care center, or emergency room. ? ?If you have a medical emergency, please immediately call 911 or go to the emergency department. ? ?Pager Numbers ? ?- Dr. Nehemiah Massed: [phone] ? ?- Dr. Laurence Ferrari: [phone] ? ?- Dr. Nicole Kindred: [phone] ? ?In the event of inclement weather, please call our main line at [phone] for an update on the status of any delays or closures. ? ?Dermatology Medication Tips: ?Please keep the boxes that topical medications come in in order to help keep track of the instructions about where and how to use these. Pharmacies typically print the medication instructions only on the boxes and not directly on the medication tubes.  ? ?If your medication is too expensive, please contact  our office at [phone] option 4 or send [place] a message through [place].  ? ?We are unable to tell what your co-pay for medications will be in advance as this is different depending on your insurance coverage. However, we may be able to find a substitute medication at lower cost or fill out paperwork to get insurance to cover a needed medication.  ? ?If a prior authorization is required to get your medication covered by your insurance company, please allow [place] 1-2 business days to complete this process. ? ?Drug prices often vary depending on where the prescription is filled and some pharmacies may offer cheaper prices. ? ?The website www.goodrx.com contains coupons for medications through different pharmacies. The prices here do not account for what the cost may be with help from insurance (it may be cheaper with your insurance), but the website can give you the price if you did not use any insurance.  ?- You can print the associated coupon and take it with your prescription to the pharmacy.  ?- You may also stop by our office during regular business hours and pick up a GoodRx coupon card.  ?- If you need your prescription sent electronically to a different pharmacy, notify our office through [facility] or by phone at [phone] option 4. ? ? ? ? ?Si Usted Necesita Algo Despu?s de Su Visita ? ?Tambi?n puede enviarnos un mensaje a trav?s de MyChart. Por lo general respondemos a los mensajes de [employer]  en el transcurso de 1 a 2 d?as h?biles. ? ?Para renovar recetas, por favor pida a su farmacia que se ponga en contacto con nuestra oficina. Nuestro n?mero de fax es el [phone]. ? ?Si tiene un asunto urgente cuando la cl?nica est? cerrada y que no puede esperar hasta el siguiente d?a h?bil, puede llamar/localizar a su doctor(a) al n?mero que aparece a continuaci?n.  ? ?Por favor, tenga en cuenta que aunque hacemos todo lo posible para estar disponibles para asuntos urgentes fuera del horario de oficina,  no estamos disponibles las 24 horas del d?a, los 7 d?as de la semana.  ? ?Si tiene un problema urgente y no puede comunicarse con nosotros, puede optar por buscar atenci?n m?dica  en el consultorio de su doctor(a), en una cl?nica privada, en un centro de atenci?n urgente o en una sala de emergencias. ? ?Si tiene Engineer, maintenance (IT) m?dica, por favor llame inmediatamente al 911 o vaya a la sala de emergencias. ? ?N?meros de b?per ? ?- Dr. Nehemiah Massed: [phone] ? ?- Dra. Moye: [phone] ? ?- Dra. Nicole Kindred: [phone] ? ?En caso de inclemencias del tiempo, por favor llame a nuestra l?nea principal al [phone] para una actualizaci?n sobre el estado de cualquier retraso o cierre. ? ?Consejos para la medicaci?n en dermatolog?a: ?Por favor, guarde las cajas en las que vienen los medicamentos de uso t?pico para ayudarle a seguir las instrucciones sobre d?nde y c?mo usarlos. Las farmacias generalmente imprimen las instrucciones del medicamento s?lo en las cajas y no directamente en los tubos del [place].  ? ?Si su medicamento es muy caro, por favor, p?ngase en contacto con Zigmund Daniel llamando al [phone] y presione la opci?n 4 o env?enos un mensaje a trav?s de MyChart.  ? ?No podemos decirle cu?l ser? su copago por los medicamentos por adelantado ya que esto es diferente dependiendo de la cobertura de su seguro. Sin embargo, es posible que podamos encontrar un medicamento sustituto a Electrical engineer un formulario para que el seguro cubra el medicamento que se considera necesario.  ? ?Si se requiere Ardelia Mems autorizaci?n previa para que su compa??a de seguros [place] su medicamento, por favor perm?tanos de 1 a 2 d?as h?biles para completar este proceso. ? ?Los precios de los medicamentos var?an con frecuencia dependiendo del Environmental consultant de d?nde se surte la receta y alguna farmacias pueden ofrecer precios m?s baratos. ? ?El sitio web www.goodrx.com tiene cupones para medicamentos de Airline pilot. Los precios  aqu? no tienen en cuenta lo que podr?a costar con la ayuda del seguro (puede ser m?s barato con su seguro), pero el sitio web puede darle el precio si no utiliz? ning?n seguro.  ?- Puede imprimir el cup?n correspondiente y llevarlo con su receta a la farmacia.  ?- Tambi?n puede pasar por nuestra oficina durante el horario de atenci?n regular y recoger una tarjeta de cupones de GoodRx.  ?- Si necesita que su receta se env?e electr?nicamente a Chiropodist, informe a nuestra oficina a trav?s de MyChart de [place] o por tel?fono llamando al [phone] y presione la opci?n 4.  ?

## 2021-03-22 ENCOUNTER — Encounter: Payer: Self-pay | Admitting: Dermatology

## 2021-03-30 ENCOUNTER — Other Ambulatory Visit: Payer: Self-pay | Admitting: Internal Medicine

## 2021-03-30 ENCOUNTER — Other Ambulatory Visit (HOSPITAL_COMMUNITY): Payer: Self-pay | Admitting: Internal Medicine

## 2021-03-30 DIAGNOSIS — M7989 Other specified soft tissue disorders: Secondary | ICD-10-CM

## 2021-03-31 ENCOUNTER — Other Ambulatory Visit: Payer: Self-pay

## 2021-03-31 ENCOUNTER — Ambulatory Visit
Admission: RE | Admit: 2021-03-31 | Discharge: 2021-03-31 | Disposition: A | Discharge status: Home / Self Care | Payer: Medicare Other | Source: Ambulatory Visit | Attending: Internal Medicine | Admitting: Internal Medicine

## 2021-03-31 DIAGNOSIS — M7989 Other specified soft tissue disorders: Secondary | ICD-10-CM | POA: Diagnosis present

## 2021-06-22 ENCOUNTER — Encounter: Payer: Self-pay | Admitting: Dermatology

## 2021-06-22 ENCOUNTER — Ambulatory Visit: Payer: Medicare Other | Admitting: Dermatology

## 2021-06-22 DIAGNOSIS — D692 Other nonthrombocytopenic purpura: Secondary | ICD-10-CM | POA: Diagnosis not present

## 2021-06-22 DIAGNOSIS — L821 Other seborrheic keratosis: Secondary | ICD-10-CM

## 2021-06-22 DIAGNOSIS — Z1283 Encounter for screening for malignant neoplasm of skin: Secondary | ICD-10-CM

## 2021-06-22 DIAGNOSIS — L578 Other skin changes due to chronic exposure to nonionizing radiation: Secondary | ICD-10-CM | POA: Diagnosis not present

## 2021-06-22 DIAGNOSIS — L820 Inflamed seborrheic keratosis: Secondary | ICD-10-CM

## 2021-06-22 NOTE — Progress Notes (Deleted)
   Follow-Up Visit   Subjective  Friend Dorfman is a 88 y.o. male who presents for the following: No chief complaint on file..  The patient has spots, moles and lesions to be evaluated, some may be new or changing and the patient has concerns that these could be cancer.   The following portions of the chart were reviewed this encounter and updated as appropriate:      Review of Systems: No other skin or systemic complaints except as noted in HPI or Assessment and Plan.   Objective  Well appearing patient in no apparent distress; mood and affect are within normal limits.  All skin waist up examined.   Assessment & Plan   No follow-ups on file.

## 2021-06-22 NOTE — Patient Instructions (Signed)
Can use OTC arnica containing moisturizer such as Dermend Bruise Formula if desired  Cryotherapy Aftercare  Wash gently with soap and water everyday.   Apply Vaseline and Band-Aid daily until healed.    Due to recent changes in healthcare laws, you may see results of your pathology and/or laboratory studies on MyChart before the doctors have had a chance to review them. We understand that in some cases there may be results that are confusing or concerning to you. Please understand that not all results are received at the same time and often the doctors may need to interpret multiple results in order to provide you with the best plan of care or course of treatment. Therefore, we ask that you please give [place] 2 business days to thoroughly review all your results before contacting the office for clarification. Should we see a critical lab result, you will be contacted sooner.   If You Need Anything After Your Visit  If you have any questions or concerns for your doctor, please call our main line at [phone] and press option 4 to reach your doctor's medical assistant. If no one answers, please leave a voicemail as directed and we will return your call as soon as possible. Messages left after 4 pm will be answered the following business day.   You may also send [place] a message via [place]. We typically respond to MyChart messages within 1-2 business days.  For prescription refills, please ask your pharmacy to contact our office. Our fax number is [phone].  If you have an urgent issue when the clinic is closed that cannot wait until the next business day, you can page your doctor at the number below.    Please note that while we do our best to be available for urgent issues outside of office hours, we are not available 24/7.   If you have an urgent issue and are unable to reach [place], you may choose to seek medical care at your doctor's office, retail clinic, urgent care center, or emergency  room.  If you have a medical emergency, please immediately call 911 or go to the emergency department.  Pager Numbers  - Dr. Nehemiah Massed: [phone]  - Dr. Laurence Ferrari: [phone]  - Dr. Nicole Kindred: [phone]  In the event of inclement weather, please call our main line at [phone] for an update on the status of any delays or closures.  Dermatology Medication Tips: Please keep the boxes that topical medications come in in order to help keep track of the instructions about where and how to use these. Pharmacies typically print the medication instructions only on the boxes and not directly on the medication tubes.   If your medication is too expensive, please contact our office at [phone] option 4 or send [place] a message through [place].   We are unable to tell what your co-pay for medications will be in advance as this is different depending on your insurance coverage. However, we may be able to find a substitute medication at lower cost or fill out paperwork to get insurance to cover a needed medication.   If a prior authorization is required to get your medication covered by your insurance company, please allow [place] 1-2 business days to complete this process.  Drug prices often vary depending on where the prescription is filled and some pharmacies may offer cheaper prices.  The website www.goodrx.com contains coupons for medications through different pharmacies. The prices here do not account for what the cost may be with help  from insurance (it may be cheaper with your insurance), but the website can give you the price if you did not use any insurance.  - You can print the associated coupon and take it with your prescription to the pharmacy.  - You may also stop by our office during regular business hours and pick up a GoodRx coupon card.  - If you need your prescription sent electronically to a different pharmacy, notify our office through [facility] or by phone at [phone]  option 4.     Si Usted Necesita Algo Despus de Su Visita  Tambin puede enviarnos un mensaje a travs de Pharmacist, community. Por lo general respondemos a los mensajes de MyChart en el transcurso de 1 a 2 das hbiles.  Para renovar recetas, por favor pida a su farmacia que se ponga en contacto con nuestra oficina. Harland Dingwall de fax es [place] [phone].  Si tiene un asunto urgente cuando la clnica est cerrada y que no puede esperar hasta el siguiente da hbil, puede llamar/localizar a su doctor(a) al nmero que aparece a continuacin.   Por favor, tenga en cuenta que aunque hacemos todo lo posible para estar disponibles para asuntos urgentes fuera del horario de [place], no estamos disponibles las 24 horas del da, los 7 das de la [place].   Si tiene un problema urgente y no puede comunicarse con nosotros, puede optar por buscar atencin mdica  en el consultorio de su doctor(a), en una clnica privada, en un centro de atencin urgente o en una sala de emergencias.  Si tiene Engineering geologist, por favor llame inmediatamente al 911 o vaya a la sala de emergencias.  Nmeros de bper  - Dr. Nehemiah Massed: [phone]  - Dra. Moye: [phone]  - Dra. Nicole Kindred: [phone]  En caso de inclemencias del [place], por favor llame a Johnsie Kindred principal al [phone] para una actualizacin sobre el [place] de cualquier retraso o cierre.  Consejos para la medicacin en dermatologa: Por favor, guarde las cajas en las que vienen los medicamentos de uso tpico para ayudarle a seguir las instrucciones sobre dnde y cmo usarlos. Las farmacias generalmente imprimen las instrucciones del medicamento slo en las cajas y no directamente en los tubos del [place].   Si su medicamento es muy caro, por favor, pngase en contacto con Zigmund Daniel llamando al [phone] y presione la opcin 4 o envenos un mensaje a travs de Pharmacist, community.   No podemos decirle cul ser su copago por los medicamentos  por adelantado ya que esto es diferente dependiendo de la cobertura de su seguro. Sin embargo, es posible que podamos encontrar un medicamento sustituto a Electrical engineer un formulario para que el seguro cubra el medicamento que se considera necesario.   Si se requiere una autorizacin previa para que su compaa de seguros [place] su medicamento, por favor permtanos de 1 a 2 das hbiles para completar este proceso.  Los precios de los medicamentos varan con frecuencia dependiendo del Environmental consultant de dnde se surte la receta y alguna farmacias pueden ofrecer precios ms baratos.  El sitio web www.goodrx.com tiene cupones para medicamentos de Airline pilot. Los precios aqu no tienen en cuenta lo que podra costar con la ayuda del seguro (puede ser ms barato con su seguro), pero el sitio web puede darle el precio si no utiliz Research scientist (physical sciences).  - Puede imprimir el cupn correspondiente y llevarlo con su receta a la farmacia.  - Tambin puede pasar por nuestra oficina durante el horario  de atencin regular y Charity fundraiser una tarjeta de cupones de GoodRx.  - Si necesita que su receta se enve electrnicamente a una farmacia diferente, informe a nuestra oficina a travs de MyChart de  o por telfono llamando al [phone] y presione la opcin 4.

## 2021-06-22 NOTE — Progress Notes (Unsigned)
   Follow-Up Visit   Subjective  Albert Harrison is a 88 y.o. male who presents for the following: Seborrheic Keratosis (ISK follow up treated with LN2). The patient presents for Upper Body Skin Exam (UBSE) for skin cancer screening and mole check.  The patient has spots, moles and lesions to be evaluated, some may be new or changing and the patient has concerns that these could be cancer.  The following portions of the chart were reviewed this encounter and updated as appropriate:   Tobacco  Allergies  Meds  Problems  Med Hx  Surg Hx  Fam Hx     Review of Systems:  No other skin or systemic complaints except as noted in HPI or Assessment and Plan.  Objective  Well appearing patient in no apparent distress; mood and affect are within normal limits.  All skin waist up examined.  Chest x 17 scalp x 1 (18) Erythematous stuck-on, waxy papule or plaque   Assessment & Plan   Actinic Damage - chronic, secondary to cumulative UV radiation exposure/sun exposure over time - diffuse scaly erythematous macules with underlying dyspigmentation - Recommend daily broad spectrum sunscreen SPF 30+ to sun-exposed areas, reapply every 2 hours as needed.  - Recommend staying in the shade or wearing long sleeves, sun glasses (UVA+UVB protection) and wide brim hats (4-inch brim around the entire circumference of the hat). - Call for new or changing lesions.  Purpura - Chronic; persistent and recurrent.  Treatable, but not curable. - Violaceous macules and patches - Benign - Related to trauma, age, sun damage and/or use of blood thinners, chronic use of topical and/or oral steroids - Observe - Can use OTC arnica containing moisturizer such as Dermend Bruise Formula if desired - Call for worsening or other concerns  Seborrheic Keratoses - Stuck-on, waxy, tan-brown papules and/or plaques  - Benign-appearing - Discussed benign etiology and prognosis. - Observe - Call for any changes  Inflamed  seborrheic keratosis (18) Chest x 17 scalp x 1  Destruction of lesion - Chest x 17 scalp x 1 Complexity: simple   Destruction method: cryotherapy   Informed consent: discussed and consent obtained   Timeout:  patient name, date of birth, surgical site, and procedure verified Lesion destroyed using liquid nitrogen: Yes   Region frozen until ice ball extended beyond lesion: Yes   Outcome: patient tolerated procedure well with no complications   Post-procedure details: wound care instructions given    Skin cancer screening   Return in about 3 months (around 09/22/2021) for ISK follow up.  I, Ashok Cordia, CMA, am acting as scribe for Sarina Ser, MD . Documentation: I have reviewed the above documentation for accuracy and completeness, and I agree with the above.  Sarina Ser, MD

## 2021-06-27 ENCOUNTER — Encounter: Payer: Self-pay | Admitting: Dermatology

## 2021-08-07 ENCOUNTER — Ambulatory Visit: Payer: Medicare Other | Admitting: Dermatology

## 2021-09-20 DIAGNOSIS — I890 Lymphedema, not elsewhere classified: Secondary | ICD-10-CM | POA: Insufficient documentation

## 2021-09-20 NOTE — Progress Notes (Signed)
MRN : [id number]  Albert Harrison is a 88 y.o. (01/12/1925) male who presents with chief complaint of legs swell.  History of Present Illness:   Patient is seen for evaluation of right leg swelling. The patient first noticed the swelling remotely but is now concerned because of an increase in the overall edema. The swelling isn't associated with significant pain.  There has been an increasing amount of  discoloration noted by the patient. The patient notes that in the morning the legs are improved but they steadily worsened throughout the course of the day. Elevation seems to make the swelling of the leg better, dependency makes it worse.   There is no history of ulcerations associated with the swelling.   The patient denies any recent changes in their medications.  The patient has been wearing graduated compression.  The patient has no had any past angiography, interventions or vascular surgery.  The patient denies a history of DVT or PE. There is no prior history of phlebitis. There is no history of primary lymphedema.  There is no history of radiation treatment to the groin or pelvis No history of malignancies. No history of trauma or groin or pelvic surgery. No history of foreign travel or parasitic infections area    No outpatient medications have been marked as taking for the 09/21/21 encounter (Appointment) with Delana Meyer, Dolores Lory, MD.    Past Medical History:  Diagnosis Date   Abdominal pain    BPH (benign prostatic hypertrophy) with urinary obstruction    Diabetes ([place])    Hyperlipemia    Hypertension    Renal cyst    Renal stone     Past Surgical History:  Procedure Laterality Date   HERNIA REPAIR      Social History Social History   Tobacco Use   Smoking status: Never   Smokeless tobacco: Never  Vaping Use   Vaping Use: Never used  Substance Use Topics   Alcohol use: No    Alcohol/week: 0.0 standard drinks of alcohol   Drug use: No    Family  History No family history on file.  Allergies  Allergen Reactions   Morphine Other (See Comments)     REVIEW OF SYSTEMS (Negative unless checked)  Constitutional: '[]'$ Weight loss  '[]'$ Fever  '[]'$ Chills Cardiac: '[]'$ Chest pain   '[]'$ Chest pressure   '[]'$ Palpitations   '[]'$ Shortness of breath when laying flat   '[]'$ Shortness of breath with exertion. Vascular:  '[]'$ Pain in legs with walking   '[]'$ Pain in legs with standing  '[]'$ History of DVT   '[]'$ Phlebitis   '[x]'$ Swelling in legs   '[]'$ Varicose veins   '[]'$ Non-healing ulcers Pulmonary:   '[]'$ Uses home oxygen   '[]'$ Productive cough   '[]'$ Hemoptysis   '[]'$ Wheeze  '[]'$ COPD   '[]'$ Asthma Neurologic:  '[]'$ Dizziness   '[]'$ Seizures   '[]'$ History of stroke   '[]'$ History of TIA  '[]'$ Aphasia   '[]'$ Vissual changes   '[]'$ Weakness or numbness in arm   '[]'$ Weakness or numbness in leg Musculoskeletal:   '[]'$ Joint swelling   '[x]'$ Joint pain   '[]'$ Low back pain Hematologic:  '[]'$ Easy bruising  '[]'$ Easy bleeding   '[]'$ Hypercoagulable state   '[]'$ Anemic Gastrointestinal:  '[]'$ Diarrhea   '[]'$ Vomiting  '[]'$ Gastroesophageal reflux/heartburn   '[]'$ Difficulty swallowing. Genitourinary:  '[]'$ Chronic kidney disease   '[]'$ Difficult urination  '[]'$ Frequent urination   '[]'$ Blood in urine Skin:  '[]'$ Rashes   '[]'$ Ulcers  Psychological:  '[]'$ History of anxiety   '[]'$  History of major depression.  Physical Examination  There were no vitals filed for this visit.  There is no height or weight on file to calculate BMI. Gen: WD/WN, NAD Head: [place]/AT, No temporalis wasting.  Ear/Nose/Throat: Hearing grossly intact, nares w/o erythema or drainage, pinna without lesions Eyes: PER, EOMI, sclera nonicteric.  Neck: Supple, no gross masses.  No JVD.  Pulmonary:  Good air movement, no audible wheezing, no use of accessory muscles.  Cardiac: RRR, precordium not hyperdynamic. Vascular:  scattered varicosities present bilaterally.  Moderate venous stasis changes to the legs bilaterally.  3-4+ soft pitting edema right leg Vessel Right Left  Radial Palpable Palpable   Gastrointestinal: soft, non-distended. No guarding/no peritoneal signs.  Musculoskeletal: M/S 5/5 throughout.  No deformity.  Neurologic: CN 2-12 intact. Pain and light touch intact in extremities.  Symmetrical.  Speech is fluent. Motor exam as listed above. Psychiatric: Judgment intact, Mood & affect appropriate for pt's clinical situation. Dermatologic: Venous rashes no ulcers noted.  No changes consistent with cellulitis. Lymph : No lichenification or skin changes of chronic lymphedema.  CBC Lab Results  Component Value Date   WBC 10.1 06/10/2017   HGB 16.4 06/10/2017   HCT 47.1 06/10/2017   MCV 89.4 06/10/2017   PLT 134 (L) 06/10/2017    BMET    Component Value Date/Time   NA 141 06/10/2017 0410   NA 143 11/16/2013 0513   K 3.7 06/10/2017 0410   K 4.0 11/16/2013 0513   CL 103 06/10/2017 0410   CL 103 11/16/2013 0513   CO2 29 06/10/2017 0410   CO2 31 11/16/2013 0513   GLUCOSE 140 (H) 06/10/2017 0410   GLUCOSE 155 (H) 11/16/2013 0513   BUN 20 06/10/2017 0410   BUN 26 (H) 11/16/2013 0513   CREATININE 1.21 06/10/2017 0410   CREATININE 1.74 (H) 11/16/2013 0513   CALCIUM 8.8 (L) 06/10/2017 0410   CALCIUM 8.3 (L) 11/16/2013 0513   GFRNONAA 51 (L) 06/10/2017 0410   GFRNONAA 40 (L) 11/16/2013 0513   GFRNONAA 46 (L) 06/13/2012 0448   GFRAA 59 (L) 06/10/2017 0410   GFRAA 48 (L) 11/16/2013 0513   GFRAA 53 (L) 06/13/2012 0448   CrCl cannot be calculated (Patient's most recent lab result is older than the maximum 21 days allowed.).  COAG Lab Results  Component Value Date   INR 0.95 06/09/2017   INR 1.0 07/30/2012    Radiology No results found.   Assessment/Plan 1. Lymphedema Recommend:  No surgery or intervention at this point in time.  I have reviewed my discussion with the patient regarding lymphedema and venous insufficiency and why it causes symptoms. I have discussed with the patient the chronic skin changes that accompany venous insufficiency and the long  term sequela such as ulceration. Patient will contnue wearing graduated compression stockings on a daily basis, as this has provided excellent control of his edema. The patient will put the stockings on first thing in the morning and removing them in the evening. The patient is reminded not to sleep in the stockings.  In addition, behavioral modification including elevation during the day will be initiated. Exercise is strongly encouraged.  Given the patient's reasonable control and lack of any problems regarding the venous insufficiency and lymphedema a lymph pump in not need at this time.    The patient will follow up with me PRN should anything change.  The patient voices agreement with this plan.   2. Mixed hyperlipidemia Continue statin as ordered and reviewed, no changes at this time   3. Primary hypertension Continue antihypertensive medications as already ordered, these medications  have been reviewed and there are no changes at this time.     Hortencia Pilar, MD  09/20/2021 12:44 PM

## 2021-09-21 ENCOUNTER — Encounter (INDEPENDENT_AMBULATORY_CARE_PROVIDER_SITE_OTHER): Payer: Self-pay | Admitting: Vascular Surgery

## 2021-09-21 ENCOUNTER — Ambulatory Visit (INDEPENDENT_AMBULATORY_CARE_PROVIDER_SITE_OTHER): Payer: Medicare Other | Admitting: Vascular Surgery

## 2021-09-21 VITALS — BP 203/83 | HR 48 | Resp 17 | Ht 66.0 in | Wt 178.0 lb

## 2021-09-21 DIAGNOSIS — I10 Essential (primary) hypertension: Secondary | ICD-10-CM | POA: Diagnosis not present

## 2021-09-21 DIAGNOSIS — E782 Mixed hyperlipidemia: Secondary | ICD-10-CM

## 2021-09-21 DIAGNOSIS — I890 Lymphedema, not elsewhere classified: Secondary | ICD-10-CM

## 2021-09-28 ENCOUNTER — Encounter (INDEPENDENT_AMBULATORY_CARE_PROVIDER_SITE_OTHER): Payer: Self-pay | Admitting: Vascular Surgery

## 2021-09-28 ENCOUNTER — Ambulatory Visit (INDEPENDENT_AMBULATORY_CARE_PROVIDER_SITE_OTHER): Payer: Medicare Other | Admitting: Dermatology

## 2021-09-28 DIAGNOSIS — L820 Inflamed seborrheic keratosis: Secondary | ICD-10-CM | POA: Diagnosis not present

## 2021-09-28 DIAGNOSIS — L821 Other seborrheic keratosis: Secondary | ICD-10-CM | POA: Diagnosis not present

## 2021-09-28 DIAGNOSIS — D692 Other nonthrombocytopenic purpura: Secondary | ICD-10-CM | POA: Diagnosis not present

## 2021-09-28 DIAGNOSIS — L578 Other skin changes due to chronic exposure to nonionizing radiation: Secondary | ICD-10-CM

## 2021-09-28 DIAGNOSIS — D1801 Hemangioma of skin and subcutaneous tissue: Secondary | ICD-10-CM

## 2021-09-28 DIAGNOSIS — L720 Epidermal cyst: Secondary | ICD-10-CM | POA: Diagnosis not present

## 2021-09-28 NOTE — Progress Notes (Signed)
   Follow-Up Visit   Subjective  Albert Harrison is a 88 y.o. male who presents for the following: ISK (Chest, scalp, 34mfu). The patient has spots, moles and lesions to be evaluated, some may be new or changing and the patient has concerns that these could be cancer.  The following portions of the chart were reviewed this encounter and updated as appropriate:   Tobacco  Allergies  Meds  Problems  Med Hx  Surg Hx  Fam Hx     Review of Systems:  No other skin or systemic complaints except as noted in HPI or Assessment and Plan.  Objective  Well appearing patient in no apparent distress; mood and affect are within normal limits.  A focused examination was performed including chest, scalp. Relevant physical exam findings are noted in the Assessment and Plan.  Right Upper Back 2.0cm cystic pap  back x 28 (28) Stuck on waxy paps with erythema   Assessment & Plan   Hemangiomas - Red papules - Discussed benign nature - Observe - Call for any changes  - back  Purpura - Chronic; persistent and recurrent.  Treatable, but not curable. - Violaceous macules and patches - Benign - Related to trauma, age, sun damage and/or use of blood thinners, chronic use of topical and/or oral steroids - Observe - Can use OTC arnica containing moisturizer such as Dermend Bruise Formula if desired - Call for worsening or other concerns  - arms  Actinic Damage - chronic, secondary to cumulative UV radiation exposure/sun exposure over time - diffuse scaly erythematous macules with underlying dyspigmentation - Recommend daily broad spectrum sunscreen SPF 30+ to sun-exposed areas, reapply every 2 hours as needed.  - Recommend staying in the shade or wearing long sleeves, sun glasses (UVA+UVB protection) and wide brim hats (4-inch brim around the entire circumference of the hat). - Call for new or changing lesions.   Seborrheic Keratoses - Stuck-on, waxy, tan-brown papules and/or plaques  -  Benign-appearing - Discussed benign etiology and prognosis. - Observe - Call for any changes  Epidermal cyst Right Upper Back Benign-appearing. Exam most consistent with an epidermal inclusion cyst. Discussed that a cyst is a benign growth that can grow over time and sometimes get irritated or inflamed. Recommend observation if it is not bothersome. Discussed option of surgical excision to remove it if it is growing, symptomatic, or other changes noted. Please call for new or changing lesions so they can be evaluated.   Inflamed seborrheic keratosis (28) back x 28 Symptomatic, irritating, patient would like treated.  Destruction of lesion - back x 28 Complexity: simple   Destruction method: cryotherapy   Informed consent: discussed and consent obtained   Timeout:  patient name, date of birth, surgical site, and procedure verified Lesion destroyed using liquid nitrogen: Yes   Region frozen until ice ball extended beyond lesion: Yes   Outcome: patient tolerated procedure well with no complications   Post-procedure details: wound care instructions given    Return in about 4 months (around 01/28/2022) for ISK f/u.  I, SOthelia Pulling RMA, am acting as scribe for DSarina Ser MD . Documentation: I have reviewed the above documentation for accuracy and completeness, and I agree with the above.  DSarina Ser MD

## 2021-09-28 NOTE — Patient Instructions (Addendum)
Cryotherapy Aftercare  Wash gently with soap and water everyday.   Apply Vaseline and Band-Aid daily until healed.     Due to recent changes in healthcare laws, you may see results of your pathology and/or laboratory studies on MyChart before the doctors have had a chance to review them. We understand that in some cases there may be results that are confusing or concerning to you. Please understand that not all results are received at the same time and often the doctors may need to interpret multiple results in order to provide you with the best plan of care or course of treatment. Therefore, we ask that you please give us 2 business days to thoroughly review all your results before contacting the office for clarification. Should we see a critical lab result, you will be contacted sooner.   If You Need Anything After Your Visit  If you have any questions or concerns for your doctor, please call our main line at [phone] and press option 4 to reach your doctor's medical assistant. If no one answers, please leave a voicemail as directed and we will return your call as soon as possible. Messages left after 4 pm will be answered the following business day.   You may also send us a message via MyChart. We typically respond to MyChart messages within 1-2 business days.  For prescription refills, please ask your pharmacy to contact our office. Our fax number is [phone].  If you have an urgent issue when the clinic is closed that cannot wait until the next business day, you can page your doctor at the number below.    Please note that while we do our best to be available for urgent issues outside of office hours, we are not available 24/7.   If you have an urgent issue and are unable to reach us, you may choose to seek medical care at your doctor's office, retail clinic, urgent care center, or emergency room.  If you have a medical emergency, please immediately call 911 or go to the  emergency department.  Pager Numbers  - Dr. Kowalski: [phone]  - Dr. Moye: [phone]  - Dr. Stewart: [phone]  In the event of inclement weather, please call our main line at [phone] for an update on the status of any delays or closures.  Dermatology Medication Tips: Please keep the boxes that topical medications come in in order to help keep track of the instructions about where and how to use these. Pharmacies typically print the medication instructions only on the boxes and not directly on the medication tubes.   If your medication is too expensive, please contact our office at [phone] option 4 or send us a message through MyChart.   We are unable to tell what your co-pay for medications will be in advance as this is different depending on your insurance coverage. However, we may be able to find a substitute medication at lower cost or fill out paperwork to get insurance to cover a needed medication.   If a prior authorization is required to get your medication covered by your insurance company, please allow us 1-2 business days to complete this process.  Drug prices often vary depending on where the prescription is filled and some pharmacies may offer cheaper prices.  The website www.goodrx.com contains coupons for medications through different pharmacies. The prices here do not account for what the cost may be with help from insurance (it may be cheaper with your insurance), but the website can   give you the price if you did not use any insurance.  - You can print the associated coupon and take it with your prescription to the pharmacy.  - You may also stop by our office during regular business hours and pick up a GoodRx coupon card.  - If you need your prescription sent electronically to a different pharmacy, notify our office through [place] MyChart or by phone at [phone] option 4.     Si Usted Necesita Algo Despus de Su Visita  Tambin puede  enviarnos un mensaje a travs de MyChart. Por lo general respondemos a los mensajes de MyChart en el transcurso de 1 a 2 das hbiles.  Para renovar recetas, por favor pida a su farmacia que se ponga en contacto con nuestra oficina. Nuestro nmero de fax es el [phone].  Si tiene un asunto urgente cuando la clnica est cerrada y que no puede esperar hasta el siguiente da hbil, puede llamar/localizar a su doctor(a) al nmero que aparece a continuacin.   Por favor, tenga en cuenta que aunque hacemos todo lo posible para estar disponibles para asuntos urgentes fuera del horario de oficina, no estamos disponibles las 24 horas del da, los 7 das de la semana.   Si tiene un problema urgente y no puede comunicarse con nosotros, puede optar por buscar atencin mdica  en el consultorio de su doctor(a), en una clnica privada, en un centro de atencin urgente o en una sala de emergencias.  Si tiene una emergencia mdica, por favor llame inmediatamente al 911 o vaya a la sala de emergencias.  Nmeros de bper  - Dr. Kowalski: [phone]  - Dra. Moye: [phone]  - Dra. Stewart: [phone]  En caso de inclemencias del tiempo, por favor llame a nuestra lnea principal al [phone] para una actualizacin sobre el estado de cualquier retraso o cierre.  Consejos para la medicacin en dermatologa: Por favor, guarde las cajas en las que vienen los medicamentos de uso tpico para ayudarle a seguir las instrucciones sobre dnde y cmo usarlos. Las farmacias generalmente imprimen las instrucciones del medicamento slo en las cajas y no directamente en los tubos del medicamento.   Si su medicamento es muy caro, por favor, pngase en contacto con nuestra oficina llamando al [phone] y presione la opcin 4 o envenos un mensaje a travs de MyChart.   No podemos decirle cul ser su copago por los medicamentos por adelantado ya que esto es diferente dependiendo de la cobertura de su seguro.  Sin embargo, es posible que podamos encontrar un medicamento sustituto a menor costo o llenar un formulario para que el seguro cubra el medicamento que se considera necesario.   Si se requiere una autorizacin previa para que su compaa de seguros cubra su medicamento, por favor permtanos de 1 a 2 das hbiles para completar este proceso.  Los precios de los medicamentos varan con frecuencia dependiendo del lugar de dnde se surte la receta y alguna farmacias pueden ofrecer precios ms baratos.  El sitio web www.goodrx.com tiene cupones para medicamentos de diferentes farmacias. Los precios aqu no tienen en cuenta lo que podra costar con la ayuda del seguro (puede ser ms barato con su seguro), pero el sitio web puede darle el precio si no utiliz ningn seguro.  - Puede imprimir el cupn correspondiente y llevarlo con su receta a la farmacia.  - Tambin puede pasar por nuestra oficina durante el horario de atencin regular y recoger una tarjeta de cupones de GoodRx.  -   Si necesita que su receta se enve electrnicamente a una farmacia diferente, informe a nuestra oficina a travs de MyChart de [place] o por telfono llamando al [phone] y presione la opcin 4.  

## 2021-10-03 ENCOUNTER — Encounter: Payer: Self-pay | Admitting: Dermatology

## 2021-10-18 ENCOUNTER — Other Ambulatory Visit
Admission: RE | Admit: 2021-10-18 | Discharge: 2021-10-18 | Disposition: A | Discharge status: Home / Self Care | Payer: Medicare Other | Attending: Ophthalmology | Admitting: Ophthalmology

## 2021-10-18 DIAGNOSIS — H4922 Sixth [abducent] nerve palsy, left eye: Secondary | ICD-10-CM | POA: Insufficient documentation

## 2021-10-18 LAB — C-REACTIVE PROTEIN: CRP: 0.6 mg/dL (ref <1.0)

## 2021-10-18 LAB — CBC WITH DIFFERENTIAL/PLATELET
Abs Immature Granulocytes: 0.01 10*3/uL (ref 0.00–0.07)
Basophils Absolute: 0 10*3/uL (ref 0.0–0.1)
Basophils Relative: 1 %
Eosinophils Absolute: 0.1 10*3/uL (ref 0.0–0.5)
Eosinophils Relative: 2 %
HCT: 46.8 % (ref 39.0–52.0)
Hemoglobin: 15.4 g/dL (ref 13.0–17.0)
Immature Granulocytes: 0 %
Lymphocytes Relative: 24 %
Lymphs Abs: 1.3 10*3/uL (ref 0.7–4.0)
MCH: 29.6 pg (ref 26.0–34.0)
MCHC: 32.9 g/dL (ref 30.0–36.0)
MCV: 90 fL (ref 80.0–100.0)
Monocytes Absolute: 0.5 10*3/uL (ref 0.1–1.0)
Monocytes Relative: 10 %
Neutro Abs: 3.4 10*3/uL (ref 1.7–7.7)
Neutrophils Relative %: 63 %
Platelets: 152 10*3/uL (ref 150–400)
RBC: 5.2 MIL/uL (ref 4.22–5.81)
RDW: 13.3 % (ref 11.5–15.5)
WBC: 5.3 10*3/uL (ref 4.0–10.5)
nRBC: 0 % (ref 0.0–0.2)

## 2021-10-18 LAB — SEDIMENTATION RATE: Sed Rate: 1 mm/hr (ref 0–20)

## 2021-10-19 LAB — ACETYLCHOLINE RECEPTOR, BINDING: Acety choline binding ab: <0.03 nmol/L (ref 0.00–0.24)

## 2021-11-06 ENCOUNTER — Encounter (INDEPENDENT_AMBULATORY_CARE_PROVIDER_SITE_OTHER): Payer: Self-pay

## 2021-11-24 ENCOUNTER — Other Ambulatory Visit
Admission: RE | Admit: 2021-11-24 | Discharge: 2021-11-24 | Disposition: A | Discharge status: Home / Self Care | Payer: Medicare Other | Source: Ambulatory Visit | Attending: Ophthalmology | Admitting: Ophthalmology

## 2021-11-24 ENCOUNTER — Other Ambulatory Visit: Payer: Self-pay | Admitting: Ophthalmology

## 2021-11-24 DIAGNOSIS — H4923 Sixth [abducent] nerve palsy, bilateral: Secondary | ICD-10-CM | POA: Diagnosis present

## 2021-11-24 LAB — ELECTROLYTE PANEL
Anion gap: 6 (ref 5–15)
CO2: 29 mmol/L (ref 22–32)
Chloride: 110 mmol/L (ref 98–111)
Potassium: 4.2 mmol/L (ref 3.5–5.1)
Sodium: 145 mmol/L (ref 135–145)

## 2021-11-29 LAB — VITAMIN B1: Vitamin B1 (Thiamine): 137.8 nmol/L (ref 66.5–200.0)

## 2021-12-04 ENCOUNTER — Ambulatory Visit
Admission: RE | Admit: 2021-12-04 | Discharge: 2021-12-04 | Disposition: A | Discharge status: Home / Self Care | Payer: Medicare Other | Source: Ambulatory Visit | Attending: Ophthalmology | Admitting: Ophthalmology

## 2021-12-04 DIAGNOSIS — H4923 Sixth [abducent] nerve palsy, bilateral: Secondary | ICD-10-CM | POA: Diagnosis present

## 2021-12-04 MED ORDER — GADOBUTROL 1 MMOL/ML IV SOLN
7.0000 mL | Freq: Once | INTRAVENOUS | Status: AC | PRN
  Administered 2021-12-04: 7 mL via INTRAVENOUS

## 2022-01-11 ENCOUNTER — Ambulatory Visit: Payer: Medicare Other | Admitting: Dermatology

## 2022-02-21 ENCOUNTER — Encounter: Payer: Self-pay | Admitting: Anesthesiology

## 2022-02-21 ENCOUNTER — Encounter: Payer: Self-pay | Admitting: Ophthalmology

## 2022-02-27 ENCOUNTER — Ambulatory Visit: Admit: 2022-02-27 | Payer: Medicare Other | Admitting: Ophthalmology

## 2022-02-27 HISTORY — DX: Presence of external hearing-aid: Z97.4

## 2022-02-27 HISTORY — DX: Presence of dental prosthetic device (complete) (partial): Z97.2

## 2022-02-27 SURGERY — PHACOEMULSIFICATION, CATARACT, WITH IOL INSERTION
Anesthesia: Topical | Laterality: Right

## 2022-03-13 ENCOUNTER — Ambulatory Visit: Admit: 2022-03-13 | Payer: Medicare Other | Admitting: Ophthalmology

## 2022-03-13 SURGERY — PHACOEMULSIFICATION, CATARACT, WITH IOL INSERTION
Anesthesia: Topical | Laterality: Left

## 2022-05-18 ENCOUNTER — Telehealth (INDEPENDENT_AMBULATORY_CARE_PROVIDER_SITE_OTHER): Payer: Self-pay | Admitting: Vascular Surgery

## 2022-05-18 NOTE — Telephone Encounter (Signed)
Returned pt's phone call from VM left on our machine. Pt did not state what he needed and I asked for a return call to the clinic. Pt did state that his cell phone was not working well so it is best to call on [phone]. We will wait a return call from pt.

## 2022-05-24 ENCOUNTER — Observation Stay
Admission: RE | Admit: 2022-05-24 | Discharge: 2022-05-25 | Disposition: A | Discharge status: Home / Self Care | Payer: Medicare Other | Attending: Cardiology | Admitting: Cardiology

## 2022-05-24 ENCOUNTER — Other Ambulatory Visit: Payer: Self-pay

## 2022-05-24 ENCOUNTER — Encounter: Payer: Self-pay | Admitting: Cardiology

## 2022-05-24 ENCOUNTER — Encounter
Admission: RE | Disposition: A | Discharge status: Home / Self Care | Payer: Self-pay | Source: Home / Self Care | Attending: Cardiology

## 2022-05-24 DIAGNOSIS — Z79899 Other long term (current) drug therapy: Secondary | ICD-10-CM | POA: Insufficient documentation

## 2022-05-24 DIAGNOSIS — R001 Bradycardia, unspecified: Secondary | ICD-10-CM

## 2022-05-24 DIAGNOSIS — I10 Essential (primary) hypertension: Secondary | ICD-10-CM | POA: Diagnosis not present

## 2022-05-24 DIAGNOSIS — I441 Atrioventricular block, second degree: Secondary | ICD-10-CM | POA: Diagnosis present

## 2022-05-24 DIAGNOSIS — Z006 Encounter for examination for normal comparison and control in clinical research program: Secondary | ICD-10-CM | POA: Insufficient documentation

## 2022-05-24 HISTORY — DX: Syncope and collapse: R55

## 2022-05-24 HISTORY — DX: Sick sinus syndrome: I49.5

## 2022-05-24 HISTORY — DX: Anemia, unspecified: D64.9

## 2022-05-24 HISTORY — DX: Unspecified hearing loss, unspecified ear: H91.90

## 2022-05-24 HISTORY — DX: Tachycardia, unspecified: R00.0

## 2022-05-24 HISTORY — DX: Hyperlipidemia, unspecified: E78.5

## 2022-05-24 HISTORY — DX: Atrioventricular block, complete: I44.2

## 2022-05-24 HISTORY — DX: Tinea pedis: B35.3

## 2022-05-24 HISTORY — PX: PACEMAKER LEADLESS INSERTION: EP1219

## 2022-05-24 HISTORY — DX: Thrombocytopenia, unspecified: D69.6

## 2022-05-24 HISTORY — DX: Bradycardia, unspecified: R00.1

## 2022-05-24 HISTORY — DX: Inflammatory disease of prostate, unspecified: N41.9

## 2022-05-24 HISTORY — DX: Other specified disorders of veins: I87.8

## 2022-05-24 LAB — HEMOGLOBIN AND HEMATOCRIT, BLOOD
HCT: 42.7 % (ref 39.0–52.0)
Hemoglobin: 13.9 g/dL (ref 13.0–17.0)

## 2022-05-24 SURGERY — PACEMAKER LEADLESS INSERTION
Anesthesia: Moderate Sedation

## 2022-05-24 MED ORDER — LIDOCAINE HCL 1 % IJ SOLN
INTRAMUSCULAR | Status: AC
  Filled 2022-05-24: qty 20

## 2022-05-24 MED ORDER — HYDRALAZINE HCL 10 MG PO TABS
10.0000 mg | ORAL_TABLET | Freq: Two times a day (BID) | ORAL | Status: DC
  Administered 2022-05-24 – 2022-05-25 (×2): 10 mg via ORAL
  Filled 2022-05-24 (×2): qty 1

## 2022-05-24 MED ORDER — SODIUM CHLORIDE 0.9% FLUSH: 3.0000 mL | INTRAVENOUS | Status: DC | PRN

## 2022-05-24 MED ORDER — SODIUM CHLORIDE 0.9% FLUSH
3.0000 mL | Freq: Two times a day (BID) | INTRAVENOUS | Status: DC
  Administered 2022-05-24: 3 mL via INTRAVENOUS

## 2022-05-24 MED ORDER — HEPARIN (PORCINE) IN NACL 1000-0.9 UT/500ML-% IV SOLN
INTRAVENOUS | Status: AC
  Filled 2022-05-24: qty 500

## 2022-05-24 MED ORDER — SODIUM CHLORIDE 0.9% FLUSH: 3.0000 mL | Freq: Two times a day (BID) | INTRAVENOUS | Status: DC

## 2022-05-24 MED ORDER — FENTANYL CITRATE (PF) 100 MCG/2ML IJ SOLN
INTRAMUSCULAR | Status: AC
  Filled 2022-05-24: qty 2

## 2022-05-24 MED ORDER — LIDOCAINE HCL (PF) 1 % IJ SOLN
INTRAMUSCULAR | Status: DC | PRN
  Administered 2022-05-24: 30 mL

## 2022-05-24 MED ORDER — KETOROLAC TROMETHAMINE 15 MG/ML IJ SOLN
15.0000 mg | Freq: Four times a day (QID) | INTRAMUSCULAR | Status: AC | PRN
  Filled 2022-05-24: qty 1

## 2022-05-24 MED ORDER — HYDRALAZINE HCL 20 MG/ML IJ SOLN
INTRAMUSCULAR | Status: DC | PRN
  Administered 2022-05-24: 20 mg via INTRAVENOUS

## 2022-05-24 MED ORDER — CHLORHEXIDINE GLUCONATE CLOTH 2 % EX PADS
6.0000 | MEDICATED_PAD | Freq: Every day | CUTANEOUS | Status: DC
  Administered 2022-05-24 – 2022-05-25 (×2): 6 via TOPICAL

## 2022-05-24 MED ORDER — MIDAZOLAM HCL 2 MG/2ML IJ SOLN
INTRAMUSCULAR | Status: DC | PRN
  Administered 2022-05-24 (×3): .5 mg via INTRAVENOUS

## 2022-05-24 MED ORDER — SODIUM CHLORIDE 0.9 % IV SOLN: 250.0000 mL | INTRAVENOUS | Status: DC | PRN

## 2022-05-24 MED ORDER — MIDAZOLAM HCL 2 MG/2ML IJ SOLN
INTRAMUSCULAR | Status: AC
  Filled 2022-05-24: qty 2

## 2022-05-24 MED ORDER — METOPROLOL TARTRATE 5 MG/5ML IV SOLN
INTRAVENOUS | Status: DC | PRN
  Administered 2022-05-24 (×2): 5 mg via INTRAVENOUS

## 2022-05-24 MED ORDER — IOHEXOL 300 MG/ML  SOLN
INTRAMUSCULAR | Status: DC | PRN
  Administered 2022-05-24: 25 mL

## 2022-05-24 MED ORDER — SODIUM CHLORIDE 0.9 % IV SOLN: INTRAVENOUS | Status: DC

## 2022-05-24 MED ORDER — FENTANYL CITRATE (PF) 100 MCG/2ML IJ SOLN
INTRAMUSCULAR | Status: DC | PRN
  Administered 2022-05-24: 25 ug via INTRAVENOUS
  Administered 2022-05-24 (×2): 12.5 ug via INTRAVENOUS

## 2022-05-24 MED ORDER — CHLORTHALIDONE 25 MG PO TABS
25.0000 mg | ORAL_TABLET | Freq: Every day | ORAL | Status: DC
  Administered 2022-05-25: 25 mg via ORAL
  Filled 2022-05-24: qty 1

## 2022-05-24 MED ORDER — ONDANSETRON HCL 4 MG/2ML IJ SOLN: 4.0000 mg | Freq: Four times a day (QID) | INTRAMUSCULAR | Status: DC | PRN

## 2022-05-24 MED ORDER — LOSARTAN POTASSIUM 50 MG PO TABS
100.0000 mg | ORAL_TABLET | Freq: Every day | ORAL | Status: DC
  Administered 2022-05-25: 100 mg via ORAL
  Filled 2022-05-24: qty 2

## 2022-05-24 MED ORDER — METOPROLOL TARTRATE 5 MG/5ML IV SOLN
INTRAVENOUS | Status: AC
  Filled 2022-05-24: qty 5

## 2022-05-24 MED ORDER — HEPARIN (PORCINE) IN NACL 1000-0.9 UT/500ML-% IV SOLN
INTRAVENOUS | Status: AC
  Filled 2022-05-24: qty 1000

## 2022-05-24 MED ORDER — HEPARIN SODIUM (PORCINE) 1000 UNIT/ML IJ SOLN
INTRAMUSCULAR | Status: AC
  Filled 2022-05-24: qty 10

## 2022-05-24 MED ORDER — DIPHENHYDRAMINE HCL 25 MG PO CAPS
50.0000 mg | ORAL_CAPSULE | Freq: Every evening | ORAL | Status: DC | PRN
  Administered 2022-05-24: 50 mg via ORAL
  Filled 2022-05-24: qty 2

## 2022-05-24 MED ORDER — HEPARIN (PORCINE) IN NACL 1000-0.9 UT/500ML-% IV SOLN
INTRAVENOUS | Status: DC | PRN
  Administered 2022-05-24: 1000 mL

## 2022-05-24 MED ORDER — HYDRALAZINE HCL 20 MG/ML IJ SOLN
INTRAMUSCULAR | Status: AC
  Filled 2022-05-24: qty 1

## 2022-05-24 MED ORDER — HEPARIN SODIUM (PORCINE) 1000 UNIT/ML IJ SOLN
INTRAMUSCULAR | Status: DC | PRN
  Administered 2022-05-24: 4000 [IU] via INTRAVENOUS

## 2022-05-24 MED ORDER — ACETAMINOPHEN 325 MG PO TABS
650.0000 mg | ORAL_TABLET | ORAL | Status: DC | PRN
  Administered 2022-05-24: 650 mg via ORAL
  Filled 2022-05-24: qty 2

## 2022-05-24 MED ORDER — DIPHENHYDRAMINE HCL 50 MG/ML IJ SOLN: 50.0000 mg | Freq: Every evening | INTRAMUSCULAR | Status: DC | PRN

## 2022-05-24 SURGICAL SUPPLY — 16 items
DILATOR VESSEL 38 20CM 12FR (INTRODUCER) IMPLANT
DILATOR VESSEL 38 20CM 14FR (INTRODUCER) IMPLANT
DILATOR VESSEL 38 20CM 18FR (INTRODUCER) IMPLANT
DILATOR VESSEL 38 20CM 8FR (INTRODUCER) IMPLANT
MICRA INTRODUCER SHEATH (SHEATH) ×1
NDL PERC 18GX7CM (NEEDLE) IMPLANT
NEEDLE PERC 18GX7CM (NEEDLE) ×1 IMPLANT
PACEMAKER LEADLESS AV2 MICRA (Pacemaker) IMPLANT
PACK CARDIAC CATH (CUSTOM PROCEDURE TRAY) ×1 IMPLANT
PAD ELECT DEFIB RADIOL ZOLL (MISCELLANEOUS) IMPLANT
PROTECTION STATION PRESSURIZED (MISCELLANEOUS) ×1
SHEATH AVANTI 7FRX11 (SHEATH) IMPLANT
SHEATH INTRODUCER MICRA (SHEATH) IMPLANT
STATION PROTECTION PRESSURIZED (MISCELLANEOUS) IMPLANT
SUT SILK 0 FSL (SUTURE) IMPLANT
WIRE AMPLATZ SS-J .035X180CM (WIRE) IMPLANT

## 2022-05-24 NOTE — Progress Notes (Addendum)
Patient had bleed twice again, changed the dressing, secure chat done to MD, vitals updated on chart.

## 2022-05-24 NOTE — Discharge Summary (Signed)
Discharge Summary      Patient ID: Albert Harrison MRN: [id number] DOB/AGE: 15-Apr-1925 89 y.o.  Admit date: 05/24/2022 Discharge date: 05/25/2022  Primary Discharge Diagnosis Mobitz type II AV block   Secondary Discharge Diagnosis s/p Micra leadless pacemaker   Significant Diagnostic Studies:   Micra Leadless Pacemaker implantation procedure note - Dr. Darrold Junker, 05/24/22 The right groin was prepped and draped in usual sterile manner. Anesthesia was obtained 1% lidocaine locally. Access to the right femoral vein was obtained with ultrasound guidance. 7 French sheath was introduced right femoral vein. The right femoral vein was dilated with 8, 12, 14 and 18 [place] dilators. Hydrophilic 23 French sheath was introduced right femoral vein. Med[employer]onic Micra AV 2 leadless pacemaker [facility] [id number]) was positioned in the right ventricular tendon with the delivery catheter. Optimal position was confirmed contrast injection in RAO and LAO views. Optimal implantation was confirmed by post implant interrogation and the pull and hold test. The delivery catheter was removed and hemostasis was achieved with a figure of eight 0 silk suture tie manual compression. Patient received 1.5 mg of Versed and 50 mcg of fentanyl. He received 4000 units of heparin. Total contrast was 30 cc. Postprocedural interrogation revealed lead impedance 750 ohms, threshold 0.75 V at 0.24 ms pulse width. Estimated blood loss <50 mL   Consults: none  Hospital Course: The patient was brought to the cardiac cath lab and underwent  [employer]onic Micra leadless pacemaker placement with Dr. Marcina Millard on 05/24/2022. The patient tolerated with procedure well without complications. The device was interrogated post procedure and is functioning appropriately with good threshold and lead impedance. The patient had some oozing from the R groin incision post procedurally which resolved with manual pressure held by his excellent nurse. On  5/17 his H/H was checked which was stable 15.4/47.2 and the incision site was examined. There was trace bruising medially to the the incision site with old clotted blood at the incision, but otherwise without significant erythema, tenderness to palpation, or apparent aneurysm. Figure of eight suture was removed at the beside by me and was covered with dry gauze and tegaderm dressing. The patient was given care instructions and warning symptoms to look out at the incision site and will follow up in office in 1 week, or sooner if needed.     Discharge Exam: Blood pressure (!) 158/72, pulse 62, temperature 97.9 F (36.6 C), resp. rate 18, height 5\' 7"  (1.702 m), weight 82.1 kg, SpO2 97 %.   PHYSICAL EXAM General: pleasant elderly male, well nourished, in no acute distress. Sitting in bed with son present HEENT:  Normocephalic and atraumatic. Neck:   No JVD.  Lungs: Normal respiratory effort on room air.  Clear to ascultation bilaterally. Heart: HRRR . Normal S1 and S2 without gallops or murmurs.  Abdomen: non-distended appearing.  Msk: Normal strength and tone for age. Extremities: No peripheral edema. Right groin figure of eight suture removed by me. Incision with old clotted blood without active bleeding. Trace ecchymosis inferomedially without significant tenderness to palpation, or apparent aneurysm Covered with clean gauze and tegaderm dressing.  Neuro: Alert and oriented x3 Psych:  calm and cooperative.   Labs:   Lab Results  Component Value Date   WBC 5.3 10/18/2021   HGB 15.4 05/25/2022   HCT 47.2 05/25/2022   MCV 90.0 10/18/2021   PLT 152 10/18/2021    Recent Labs  Lab 05/25/22 0519  NA 144  K 3.6  CL 112*  CO2 26  BUN 27*  CREATININE 1.44*  CALCIUM 8.4*  GLUCOSE 94      Radiology: none  EKG: A sensed V paced 66bpm   FOLLOW UP PLANS AND APPOINTMENTS  Allergies as of 05/25/2022       Reactions   Morphine Itching   Mental status changes        Medication  List     STOP taking these medications    meloxicam 7.5 MG tablet Commonly known as: MOBIC       TAKE these medications    chlorthalidone 25 MG tablet Commonly known as: HYGROTON Take 25 mg by mouth daily.   furosemide 20 MG tablet Commonly known as: LASIX Take 20 mg by mouth daily as needed for fluid.   Horse Chestnut 300 MG Caps Take 1 capsule by mouth daily.   hydrALAZINE 10 MG tablet Commonly known as: APRESOLINE Take 10 mg by mouth 2 (two) times daily.   ibuprofen 200 MG tablet Commonly known as: ADVIL Take 400 mg by mouth every 6 (six) hours as needed.   JOINT HEALTH PO Take 1 tablet by mouth daily.   L-GLUTAMINE/CHOLINE/INOSITOL PO Take 500 mg by mouth daily.   losartan 100 MG tablet Commonly known as: COZAAR Take 100 mg by mouth daily.   PRESERVISION AREDS 2+MULTI VIT PO Take 1 capsule by mouth daily.   VITAMIN D (ERGOCALCIFEROL) PO Take 1,250 mcg by mouth once a week.        Follow-up Information     Paraschos, Alexander, MD. Go in 1 week(s).   Specialty: Cardiology Why: Follow up with Marijo Conception, NP in 1 week Contact information: [address] [facility] [place] [address] [phone]                 PLEASE BRING ALL MEDICATIONS WITH YOU TO FOLLOW UP APPOINTMENTS  Time spent with patient: >30 mins Signed:  Rebeca Allegra PA-C 05/25/2022, 9:36 AM [facility]

## 2022-05-24 NOTE — Discharge Instructions (Signed)
Please avoid showering until Saturday 5/18 and do not submerge yourself in water (baths, oceans, swimming pools) for the next week. If your bandage gets wet or starts to fall off, replace it with another piece of gauze and the Tegaderm (clear bandage I provided). You should try to keep it covered for a week. You will follow up with Marijo Conception, NP in the office in about 1 week. If you have bleeding from the site, lie down and apply firm pressure for 20 minutes, if it continues to bleed, call our office [phone]). Avoid heavy lifting, squatting (other than sitting) and strenuous activity for 1 week. You will get a call from [employer] to set up your pacemaker and the CareLink device. You do not need to bring this device with you to appointments. It is used to monitor your pacemaker from home.

## 2022-05-24 NOTE — Progress Notes (Signed)
Patient was bleeding from the femoral site, access for his pacemaker placement, gauze wet and some more blood seen on his bed pads, pressure applied for 20 - 25 minutes, cleaned up and changed the bed pads, changed new dressing on the site, vitals taken, BP was 129/64 and pulse was 62. MD made aware of through secure chat, Hemoglobin and hematocrits was ordered.

## 2022-05-25 ENCOUNTER — Encounter: Payer: Self-pay | Admitting: Cardiology

## 2022-05-25 DIAGNOSIS — I441 Atrioventricular block, second degree: Secondary | ICD-10-CM | POA: Diagnosis not present

## 2022-05-25 DIAGNOSIS — R001 Bradycardia, unspecified: Secondary | ICD-10-CM | POA: Diagnosis not present

## 2022-05-25 LAB — HEMOGLOBIN AND HEMATOCRIT, BLOOD
HCT: 47.2 % (ref 39.0–52.0)
Hemoglobin: 15.4 g/dL (ref 13.0–17.0)

## 2022-05-25 LAB — BASIC METABOLIC PANEL
Anion gap: 6 (ref 5–15)
BUN: 27 mg/dL — ABNORMAL HIGH (ref 8–23)
CO2: 26 mmol/L (ref 22–32)
Calcium: 8.4 mg/dL — ABNORMAL LOW (ref 8.9–10.3)
Chloride: 112 mmol/L — ABNORMAL HIGH (ref 98–111)
Creatinine, Ser: 1.44 mg/dL — ABNORMAL HIGH (ref 0.61–1.24)
GFR, Estimated: 45 mL/min — ABNORMAL LOW (ref >60)
Glucose, Bld: 94 mg/dL (ref 70–99)
Potassium: 3.6 mmol/L (ref 3.5–5.1)
Sodium: 144 mmol/L (ref 135–145)

## 2022-05-25 NOTE — Progress Notes (Signed)
  Transition of Care [facility]) Screening Note   Patient Details  Name: Albert Harrison Date of Birth: Nov 12, 1925   Transition [facility]) CM/SW Contact:    Truddie Hidden, RN Phone Number: 05/25/2022, 10:15 AM    Transition of Care Department [facility]) has reviewed patient and no TOC needs have been identified at this time. We will continue to monitor patient advancement through interdisciplinary progression rounds. If new patient transition needs arise, please place a TOC consult.

## 2022-05-31 ENCOUNTER — Encounter (INDEPENDENT_AMBULATORY_CARE_PROVIDER_SITE_OTHER): Payer: Self-pay | Admitting: Vascular Surgery

## 2022-05-31 ENCOUNTER — Ambulatory Visit (INDEPENDENT_AMBULATORY_CARE_PROVIDER_SITE_OTHER): Payer: Medicare Other | Admitting: Vascular Surgery

## 2022-05-31 VITALS — BP 156/73 | HR 61 | Resp 18 | Ht 67.0 in | Wt 185.8 lb

## 2022-05-31 DIAGNOSIS — E782 Mixed hyperlipidemia: Secondary | ICD-10-CM

## 2022-05-31 DIAGNOSIS — I890 Lymphedema, not elsewhere classified: Secondary | ICD-10-CM

## 2022-05-31 DIAGNOSIS — I10 Essential (primary) hypertension: Secondary | ICD-10-CM | POA: Diagnosis not present

## 2022-05-31 NOTE — Progress Notes (Signed)
MRN : [id number]  Albert Harrison is a 89 y.o. (Nov 29, 1925) male who presents with chief complaint of legs swell.  History of Present Illness:  Patient is seen for evaluation of leg swelling.  He notes that his right leg is severely affected and his left leg really just swells a bit.  He states that is always been like this disproportionately so.  The patient first noticed the swelling remotely but is now concerned because of a significant increase in the overall edema. The swelling isn't associated with significant pain.  There has been an increasing amount of  discoloration noted by the patient. The patient notes that in the morning the legs are improved but they steadily worsened throughout the course of the day. Elevation seems to make the swelling of the legs a little better, dependency makes them much worse.   There is no history of ulcerations associated with the swelling.   The patient denies any recent changes in their medications.  The patient has been wearing graduated compression for a long time and actually has them on for this visit.  The patient has no had any past angiography, interventions or vascular surgery.  The patient denies a history of DVT or PE. There is no prior history of phlebitis. There is no history of primary lymphedema.  There is no history of radiation treatment to the groin or pelvis No history of malignancies. No history of trauma or groin or pelvic surgery. No history of foreign travel or parasitic infections area   Current Meds  Medication Sig   chlorthalidone (HYGROTON) 25 MG tablet Take 25 mg by mouth daily.   furosemide (LASIX) 20 MG tablet Take 20 mg by mouth daily as needed for fluid.    H[place] Chestnut 300 MG CAPS Take 1 capsule by mouth daily.   hydrALAZINE (APRESOLINE) 10 MG tablet Take 10 mg by mouth 2 (two) times daily.   ibuprofen (ADVIL) 200 MG tablet Take 400 mg by mouth every 6 (six) hours as needed.    losartan (COZAAR) 100 MG tablet Take 100 mg by mouth daily.   meloxicam (MOBIC) 7.5 MG tablet Take 7.5 mg by mouth daily.   Misc Natural Products (JOINT HEALTH PO) Take 1 tablet by mouth daily.   Multiple Vitamins-Minerals (PRESERVISION AREDS 2+MULTI VIT PO) Take 1 capsule by mouth daily.   Nutritional Supplements (L-GLUTAMINE/CHOLINE/INOSITOL PO) Take 500 mg by mouth daily.   VITAMIN D, ERGOCALCIFEROL, PO Take 1,250 mcg by mouth once a week.    Past Medical History:  Diagnosis Date   Abdominal pain    Anemia    BPH (benign prostatic hypertrophy) with urinary obstruction    Bradycardia    Complete heart block (HCC)    Diabetes (HCC)    Hard of hearing    Hyperlipidemia    Hypertension    Prostatitis    Renal cyst    Renal stone    Sick sinus syndrome (HCC)    Syncope    Tachycardia    Thrombocytopenia (HCC)    Tinea pedis    Venous stasis    Wears dentures    partial lower   Wears hearing aid in both ears     Past Surgical History:  Procedure Laterality Date   HERNIA REPAIR     PACEMAKER LEADLESS INSERTION N/A 05/24/2022  Procedure: PACEMAKER LEADLESS INSERTION;  Surgeon: Marcina Millard, MD;  Location: [facility] INVASIVE CV LAB;  Service: Cardiovascular;  Laterality: N/A;    Social History Social History   Tobacco Use   Smoking status: Never   Smokeless tobacco: Never  Vaping Use   Vaping Use: Never used  Substance Use Topics   Alcohol use: No    Alcohol/week: 0.0 standard drinks of alcohol   Drug use: No    Family History History reviewed. No pertinent family history.  Allergies  Allergen Reactions   Morphine Itching    Mental status changes     REVIEW OF SYSTEMS (Negative unless checked)  Constitutional: [] Weight loss  [] Fever  [] Chills Cardiac: [] Chest pain   [] Chest pressure   [] Palpitations   [] Shortness of breath when laying flat   [] Shortness of breath with exertion. Vascular:  [] Pain in legs with walking   [x] Pain in legs with standing   [] History of DVT   [] Phlebitis   [x] Swelling in legs   [] Varicose veins   [] Non-healing ulcers Pulmonary:   [] Uses home oxygen   [] Productive cough   [] Hemoptysis   [] Wheeze  [] COPD   [] Asthma Neurologic:  [] Dizziness   [] Seizures   [] History of stroke   [] History of TIA  [] Aphasia   [] Vissual changes   [] Weakness or numbness in arm   [] Weakness or numbness in leg Musculoskeletal:   [] Joint swelling   [] Joint pain   [] Low back pain Hematologic:  [] Easy bruising  [] Easy bleeding   [] Hypercoagulable state   [] Anemic Gastrointestinal:  [] Diarrhea   [] Vomiting  [] Gastroesophageal reflux/heartburn   [] Difficulty swallowing. Genitourinary:  [] Chronic kidney disease   [] Difficult urination  [] Frequent urination   [] Blood in urine Skin:  [] Rashes   [] Ulcers  Psychological:  [] History of anxiety   []  History of major depression.  Physical Examination  Vitals:   05/31/22 1555  BP: (!) 156/73  Pulse: 61  Resp: 18  Weight: 185 lb 12.8 oz (84.3 kg)  Height: 5\' 7"  (1.702 m)   Body mass index is 29.1 kg/m. Gen: WD/WN, NAD Head: [place]/AT, No temporalis wasting.  Ear/Nose/Throat: Hearing grossly intact, nares w/o erythema or drainage, pinna without lesions Eyes: PER, EOMI, sclera nonicteric.  Neck: Supple, no gross masses.  No JVD.  Pulmonary:  Good air movement, no audible wheezing, no use of accessory muscles.  Cardiac: RRR, precordium not hyperdynamic. Vascular:    Mild venous stasis changes to the legs bilaterally.  3-4+ soft pitting edema right leg with 1-2+ edema of the left leg, CEAP [id number]  Vessel Right Left  Radial Palpable Palpable  Gastrointestinal: soft, non-distended. No guarding/no peritoneal signs.  Musculoskeletal: M/S 5/5 throughout.  No deformity.  Neurologic: CN 2-12 intact. Pain and light touch intact in extremities.  Symmetrical.  Speech is fluent. Motor exam as listed above. Psychiatric: Judgment intact, Mood & affect appropriate for pt's clinical situation. Dermatologic:  Venous rashes no ulcers noted.  No changes consistent with cellulitis. Lymph : No lichenification or skin changes of chronic lymphedema.  CBC Lab Results  Component Value Date   WBC 5.3 10/18/2021   HGB 15.4 05/25/2022   HCT 47.2 05/25/2022   MCV 90.0 10/18/2021   PLT 152 10/18/2021    BMET    Component Value Date/Time   NA 144 05/25/2022 0519   NA 143 11/16/2013 0513   K 3.6 05/25/2022 0519   K 4.0 11/16/2013 0513   CL 112 (H) 05/25/2022 0519   CL 103 11/16/2013 0513   CO2  26 05/25/2022 0519   CO2 31 11/16/2013 0513   GLUCOSE 94 05/25/2022 0519   GLUCOSE 155 (H) 11/16/2013 0513   BUN 27 (H) 05/25/2022 0519   BUN 26 (H) 11/16/2013 0513   CREATININE 1.44 (H) 05/25/2022 0519   CREATININE 1.74 (H) 11/16/2013 0513   CALCIUM 8.4 (L) 05/25/2022 0519   CALCIUM 8.3 (L) 11/16/2013 0513   GFRNONAA 45 (L) 05/25/2022 0519   GFRNONAA 40 (L) 11/16/2013 0513   GFRNONAA 46 (L) 06/13/2012 0448   GFRAA 59 (L) 06/10/2017 0410   GFRAA 48 (L) 11/16/2013 0513   GFRAA 53 (L) 06/13/2012 0448   Estimated Creatinine Clearance: 32.6 mL/min (A) (by C-G formula based on SCr of 1.44 mg/dL (H)).  COAG Lab Results  Component Value Date   INR 0.95 06/09/2017   INR 1.0 07/30/2012    Radiology EP PPM/ICD IMPLANT  Result Date: 05/24/2022 Successful Micra AV 2 leadless pacemaker implantation     Assessment/Plan 1. Lymphedema Recommend:  I have had a long discussion with the patient regarding swelling and why it  causes symptoms.  Patient will begin wearing graduated compression on a daily basis a prescription was given. The patient will  wear the stockings first thing in the morning and removing them in the evening. The patient is instructed specifically not to sleep in the stockings.   In addition, behavioral modification will be initiated.  This will include frequent elevation, use of over the counter pain medications and exercise such as walking.  The patient has a lymph pump at home  which was obtained for his wife but was under the impression that he should not be using it secondary to his cardiac arrhythmia.  I will investigate this but I am unaware of any reason why his pacemaker would preclude him from using a lymph pump.    I also discussed that perhaps we should have him evaluated directly for his own but he would like to try the 1 that he has prior to obtaining a new one.  The patient will follow-up with me in 3 months.   2. Mixed hyperlipidemia Continue statin as ordered and reviewed, no changes at this time  3. Primary hypertension Continue antihypertensive medications as already ordered, these medications have been reviewed and there are no changes at this time.    Levora Dredge, MD  05/31/2022 4:58 PM

## 2022-06-18 ENCOUNTER — Encounter: Payer: Self-pay | Admitting: Emergency Medicine

## 2022-06-18 ENCOUNTER — Other Ambulatory Visit: Payer: Self-pay

## 2022-06-18 ENCOUNTER — Emergency Department: Payer: Medicare Other

## 2022-06-18 ENCOUNTER — Emergency Department
Admission: EM | Admit: 2022-06-18 | Discharge: 2022-06-18 | Disposition: A | Discharge status: Home / Self Care | Payer: Medicare Other | Attending: Emergency Medicine | Admitting: Emergency Medicine

## 2022-06-18 DIAGNOSIS — I10 Essential (primary) hypertension: Secondary | ICD-10-CM | POA: Insufficient documentation

## 2022-06-18 DIAGNOSIS — E119 Type 2 diabetes mellitus without complications: Secondary | ICD-10-CM | POA: Diagnosis not present

## 2022-06-18 DIAGNOSIS — J189 Pneumonia, unspecified organism: Secondary | ICD-10-CM

## 2022-06-18 DIAGNOSIS — R0600 Dyspnea, unspecified: Secondary | ICD-10-CM | POA: Insufficient documentation

## 2022-06-18 DIAGNOSIS — R0602 Shortness of breath: Secondary | ICD-10-CM

## 2022-06-18 DIAGNOSIS — R600 Localized edema: Secondary | ICD-10-CM | POA: Diagnosis not present

## 2022-06-18 LAB — BASIC METABOLIC PANEL
Anion gap: 10 (ref 5–15)
BUN: 34 mg/dL — ABNORMAL HIGH (ref 8–23)
CO2: 27 mmol/L (ref 22–32)
Calcium: 9.3 mg/dL (ref 8.9–10.3)
Chloride: 105 mmol/L (ref 98–111)
Creatinine, Ser: 1.35 mg/dL — ABNORMAL HIGH (ref 0.61–1.24)
GFR, Estimated: 49 mL/min — ABNORMAL LOW (ref >60)
Glucose, Bld: 83 mg/dL (ref 70–99)
Potassium: 3.7 mmol/L (ref 3.5–5.1)
Sodium: 142 mmol/L (ref 135–145)

## 2022-06-18 LAB — TROPONIN I (HIGH SENSITIVITY): Troponin I (High Sensitivity): 13 ng/L (ref <18)

## 2022-06-18 LAB — CBC
HCT: 45.8 % (ref 39.0–52.0)
Hemoglobin: 14.8 g/dL (ref 13.0–17.0)
MCH: 29.9 pg (ref 26.0–34.0)
MCHC: 32.3 g/dL (ref 30.0–36.0)
MCV: 92.5 fL (ref 80.0–100.0)
Platelets: 128 10*3/uL — ABNORMAL LOW (ref 150–400)
RBC: 4.95 MIL/uL (ref 4.22–5.81)
RDW: 13.1 % (ref 11.5–15.5)
WBC: 4.8 10*3/uL (ref 4.0–10.5)
nRBC: 0 % (ref 0.0–0.2)

## 2022-06-18 LAB — HEPATIC FUNCTION PANEL
ALT: 15 U/L (ref 0–44)
AST: 18 U/L (ref 15–41)
Albumin: 4.4 g/dL (ref 3.5–5.0)
Alkaline Phosphatase: 70 U/L (ref 38–126)
Bilirubin, Direct: 0.2 mg/dL (ref 0.0–0.2)
Indirect Bilirubin: 1 mg/dL — ABNORMAL HIGH (ref 0.3–0.9)
Total Bilirubin: 1.2 mg/dL (ref 0.3–1.2)
Total Protein: 6.4 g/dL — ABNORMAL LOW (ref 6.5–8.1)

## 2022-06-18 LAB — BRAIN NATRIURETIC PEPTIDE: B Natriuretic Peptide: 149.1 pg/mL — ABNORMAL HIGH (ref 0.0–100.0)

## 2022-06-18 MED ORDER — IPRATROPIUM-ALBUTEROL 0.5-2.5 (3) MG/3ML IN SOLN
3.0000 mL | Freq: Once | RESPIRATORY_TRACT | Status: AC
  Administered 2022-06-18: 3 mL via RESPIRATORY_TRACT
  Filled 2022-06-18: qty 3

## 2022-06-18 MED ORDER — AZITHROMYCIN 250 MG PO TABS: ORAL_TABLET | ORAL | 0 refills | Status: AC

## 2022-06-18 NOTE — ED Triage Notes (Signed)
Please update son Kaylob Wallen [phone].  KC called him and told him he was being brought to the ED.  THe patient does not have his phone today.

## 2022-06-18 NOTE — ED Triage Notes (Signed)
Says sob since last Wednesday--not worse, but not any better either.  Went to [facility] for this and they were concerned about pacemaker and the sob.

## 2022-06-18 NOTE — ED Notes (Signed)
Dr. Paduchowski at bedside.  

## 2022-06-18 NOTE — ED Notes (Signed)
Patient ambulated to hallway bathroom with use of cane and student at standby. Patient appears to be more comfortable and  no dyspnea noted.

## 2022-06-18 NOTE — ED Triage Notes (Signed)
Came to [facility] for labored breathing and crackles in chest from [facility].  Possible problem with pacemaker--irregular rate.  Swelling in legs.

## 2022-06-18 NOTE — Discharge Instructions (Addendum)
As we discussed your x-ray shows a possible developing pneumonia in the left lung base.  Please take your antibiotic and follow-up with your doctor in 1 to 2 days for recheck/reevaluation.  Return to the emergency department for any trouble breathing any chest pain or any other symptom concerning to yourself.

## 2022-06-18 NOTE — ED Provider Notes (Signed)
[facility] Provider Note    Event Date/Time   First MD Initiated Contact with Patient 06/18/22 1232     (approximate)  History   Chief Complaint: Shortness of Breath  HPI  Travian Badenhop is a 89 y.o. male with a past medical history of anemia, diabetes, hypertension, hyperlipidemia, presents to the emergency department for shortness of breath.  According to the patient for the past week or more he states he has a sensation that he cannot take a deep breath.  States he feels well otherwise.  Denies any shortness of breath at rest or with exertion.  States mild lower extremity edema but states this is actually better than typical or about what it typically is.  Denies any worsening edema.  Denies any recent cough congestion or fever.  [facility] and [facility] referred him to the emergency department.  Here the patient states he is ready to go home and is regretting coming to the hospital.  Physical Exam   Triage Vital Signs: ED Triage Vitals  Enc Vitals Group     BP 06/18/22 1100 (!) 162/85     Pulse Rate 06/18/22 1100 (!) 59     Resp 06/18/22 1100 18     Temp 06/18/22 1100 97.7 F (36.5 C)     Temp Source 06/18/22 1100 Oral     SpO2 06/18/22 1100 96 %     Weight --      Height --      Head Circumference --      Peak Flow --      Pain Score 06/18/22 1057 0     Pain Loc --      Pain Edu? --      Excl. in GC? --     Most recent vital signs: Vitals:   06/18/22 1100  BP: (!) 162/85  Pulse: (!) 59  Resp: 18  Temp: 97.7 F (36.5 C)  SpO2: 96%    General: Awake, no distress.  CV:  Good peripheral perfusion.  Regular rate and rhythm  Resp:  Normal effort.  Equal breath sounds bilaterally.  Abd:  No distention.  Soft, nontender.  No rebound or guarding. Other:  Mild lower extremity edema.  No significant edema.  Right greater than left however patient states this is chronic and unchanged.   ED Results / Procedures / Treatments    EKG  EKG viewed and interpreted by myself shows a ventricular paced rhythm at 61 bpm with a widened QRS, nonspecific ST changes.  RADIOLOGY  I have reviewed and interpreted the chest x-ray images.  No obvious consolidation seen on my evaluation.   MEDICATIONS ORDERED IN ED: Medications  ipratropium-albuterol (DUONEB) 0.5-2.5 (3) MG/3ML nebulizer solution 3 mL (has no administration in time range)     IMPRESSION / MDM / ASSESSMENT AND PLAN / ED COURSE  I reviewed the triage vital signs and the nursing notes.  Patient's presentation is most consistent with acute presentation with potential threat to life or bodily function.  Patient presents emergency department for trouble taking a deep breath.  Patient denies any pain taking a deep breath, denies any shortness of breath either.  States he just feels like when he takes a deep breath he is not getting a full breath of air.  Patient's physical exam is reassuring, patient appears very well.  Patient's chest x-ray appears clear awaiting radiology read.  Patient's lab work shows a reassuring chemistry reassuring CBC with normal  LFTs.  Troponin and BNP are pending.  We will also treat with an albuterol nebulizer although clear lung sounds on my exam to see if this helps with the patient's subjective trouble taking a deep breath.  Patient's chest x-ray is read as a hazy opacity in left lung base possibly representing atelectasis versus infiltrate.  Patient denies any cough however given his sensation of not being able to take a deep breath with a chest x-ray finding we will cover with Zithromax as a precaution have the patient follow-up with his primary care doctor within the next 1 to 2 days.  Patient agreeable to plan of care.  FINAL CLINICAL IMPRESSION(S) / ED DIAGNOSES   Dyspnea  Note:  This document was prepared using Dragon voice recognition software and may include unintentional dictation errors.   Minna Antis, MD 06/18/22  1340

## 2022-06-21 ENCOUNTER — Other Ambulatory Visit: Payer: Self-pay | Admitting: Internal Medicine

## 2022-06-21 DIAGNOSIS — R0602 Shortness of breath: Secondary | ICD-10-CM

## 2022-06-21 DIAGNOSIS — J9811 Atelectasis: Secondary | ICD-10-CM

## 2022-06-27 ENCOUNTER — Ambulatory Visit
Admission: RE | Admit: 2022-06-27 | Discharge: 2022-06-27 | Disposition: A | Discharge status: Home / Self Care | Payer: Medicare Other | Source: Ambulatory Visit | Attending: Internal Medicine | Admitting: Internal Medicine

## 2022-06-27 DIAGNOSIS — J9811 Atelectasis: Secondary | ICD-10-CM | POA: Insufficient documentation

## 2022-06-27 DIAGNOSIS — R0602 Shortness of breath: Secondary | ICD-10-CM | POA: Insufficient documentation

## 2022-08-03 ENCOUNTER — Other Ambulatory Visit: Payer: Self-pay | Admitting: Internal Medicine

## 2022-08-03 DIAGNOSIS — R600 Localized edema: Secondary | ICD-10-CM

## 2022-08-06 ENCOUNTER — Ambulatory Visit
Admission: RE | Admit: 2022-08-06 | Discharge: 2022-08-06 | Disposition: A | Discharge status: Home / Self Care | Payer: Medicare Other | Source: Ambulatory Visit | Attending: Internal Medicine | Admitting: Internal Medicine

## 2022-08-06 DIAGNOSIS — R600 Localized edema: Secondary | ICD-10-CM | POA: Diagnosis present

## 2022-08-24 ENCOUNTER — Emergency Department
Admission: EM | Admit: 2022-08-24 | Discharge: 2022-08-24 | Disposition: A | Discharge status: Home / Self Care | Payer: Medicare Other | Attending: Emergency Medicine | Admitting: Emergency Medicine

## 2022-08-24 ENCOUNTER — Emergency Department: Payer: Medicare Other

## 2022-08-24 ENCOUNTER — Other Ambulatory Visit: Payer: Self-pay

## 2022-08-24 DIAGNOSIS — R0602 Shortness of breath: Secondary | ICD-10-CM | POA: Diagnosis not present

## 2022-08-24 DIAGNOSIS — I10 Essential (primary) hypertension: Secondary | ICD-10-CM | POA: Diagnosis not present

## 2022-08-24 LAB — CBC
HCT: 51 % (ref 39.0–52.0)
Hemoglobin: 16.2 g/dL (ref 13.0–17.0)
MCH: 29.5 pg (ref 26.0–34.0)
MCHC: 31.8 g/dL (ref 30.0–36.0)
MCV: 92.7 fL (ref 80.0–100.0)
Platelets: 120 10*3/uL — ABNORMAL LOW (ref 150–400)
RBC: 5.5 MIL/uL (ref 4.22–5.81)
RDW: 13.6 % (ref 11.5–15.5)
WBC: 6.7 10*3/uL (ref 4.0–10.5)
nRBC: 0 % (ref 0.0–0.2)

## 2022-08-24 LAB — BASIC METABOLIC PANEL
Anion gap: 8 (ref 5–15)
BUN: 29 mg/dL — ABNORMAL HIGH (ref 8–23)
CO2: 28 mmol/L (ref 22–32)
Calcium: 9.1 mg/dL (ref 8.9–10.3)
Chloride: 103 mmol/L (ref 98–111)
Creatinine, Ser: 1.21 mg/dL (ref 0.61–1.24)
GFR, Estimated: 55 mL/min — ABNORMAL LOW (ref >60)
Glucose, Bld: 93 mg/dL (ref 70–99)
Potassium: 4.4 mmol/L (ref 3.5–5.1)
Sodium: 139 mmol/L (ref 135–145)

## 2022-08-24 NOTE — ED Notes (Signed)
Spoke with pt's son and updated on plan of care for pt. Per son please call with any new updates and if any need for pt to have ride home given pt's confusion

## 2022-08-24 NOTE — ED Notes (Signed)
See triage note, pt reports was at [facility] for having a hard time taking a deep breath. Pt denies shob currently, RR even and unlabored, able to speak in complete sentences. Denies cp, h/a. Pt HTN on arrival, states took meds this morning.  Alert and oriented.

## 2022-08-24 NOTE — ED Notes (Signed)
Pt taken to xray 

## 2022-08-24 NOTE — ED Provider Notes (Signed)
[facility] Provider Note   Event Date/Time   First MD Initiated Contact with Patient 08/24/22 1144     (approximate) History  Shortness of Breath  HPI Albert Harrison is a 89 y.o. male with a stated past medical history of lymphedema, ACS, and hypertension who presents complaining of shortness of breath intermittently over the last few weeks.  Patient states that this sensation occurs maybe once a day and last for approximately 30 seconds after resolving spontaneously.  Patient denies any exacerbating or relieving factors for this pain.  Patient states that he was seen in office visit earlier today who sent him to the emergency department for the shortness of breath.  Patient does not endorse significant shortness of breath stating that "I can always catch my breath". ROS: Patient currently denies any vision changes, tinnitus, difficulty speaking, facial droop, sore throat, chest pain, shortness of breath, abdominal pain, nausea/vomiting/diarrhea, dysuria, or weakness/numbness/paresthesias in any extremity   Physical Exam  Triage Vital Signs: ED Triage Vitals  Encounter Vitals Group     BP 08/24/22 1125 (!) 208/81     Systolic BP Percentile --      Diastolic BP Percentile --      Pulse Rate 08/24/22 1125 60     Resp 08/24/22 1125 18     Temp 08/24/22 1125 98.1 F (36.7 C)     Temp Source 08/24/22 1128 Oral     SpO2 08/24/22 1125 100 %     Weight --      Height --      Head Circumference --      Peak Flow --      Pain Score 08/24/22 1047 0     Pain Loc --      Pain Education --      Exclude from Growth Chart --    Most recent vital signs: Vitals:   08/24/22 1128 08/24/22 1151  BP: (!) 203/81 (!) 195/84  Pulse: (!) 59 (!) 59  Resp: 18   Temp: 98.7 F (37.1 C)   SpO2: 99% 99%   General: Awake, oriented x4. CV:  Good peripheral perfusion.  Resp:  Normal effort.  Abd:  No distention.  Other:  Elderly overweight Caucasian male laying in bed resting  comfortably in no acute distress ED Results / Procedures / Treatments  Labs (all labs ordered are listed, but only abnormal results are displayed) Labs Reviewed  BASIC METABOLIC PANEL - Abnormal; Notable for the following components:      Result Value   BUN 29 (*)    GFR, Estimated 55 (*)    All other components within normal limits  CBC - Abnormal; Notable for the following components:   Platelets 120 (*)    All other components within normal limits   EKG ED ECG REPORT I, Merwyn Katos, the attending physician, personally viewed and interpreted this ECG. Date: 08/24/2022 EKG Time: 1120 Rate: 60 Rhythm: Ventricular paced rhythm QRS Axis: normal Intervals: normal ST/T Wave abnormalities: normal Narrative Interpretation: Ventricular paced rhythm.  No evidence of acute ischemia RADIOLOGY ED MD interpretation: 2 view chest x-ray interpreted independently by me and shows enlarged heart with bibasilar atelectasis and a loop recorder -Agree with radiology assessment Official radiology report(s): DG Chest 2 View  Result Date: 08/24/2022 CLINICAL DATA:  Shortness of breath EXAM: CHEST - 2 VIEW COMPARISON:  06/18/2022 FINDINGS: Heart is slightly enlarged including the right ventricle. Slight linear opacity lung bases likely scar or atelectasis. No consolidation,  pneumothorax or effusion. No edema. Degenerative changes are seen along the spine. The visualized skeletal structures are unremarkable. Loop recorder IMPRESSION: Enlarged heart.  Basilar atelectasis.  Loop recorder Electronically Signed   By: Karen Kays M.D.   On: 08/24/2022 11:37   PROCEDURES: Critical Care performed: No Procedures MEDICATIONS ORDERED IN ED: Medications - No data to display IMPRESSION / MDM / ASSESSMENT AND PLAN / ED COURSE  I reviewed the triage vital signs and the nursing notes.                             The patient is on the cardiac monitor to evaluate for evidence of arrhythmia and/or significant  heart rate changes. Patient's presentation is most consistent with acute presentation with potential threat to life or bodily function. The patient is suffering from shortness of breath, but the immediate cause is not apparent.  Potential causes considered include, but are not limited to, asthma or COPD, congestive heart failure, pulmonary embolism, pneumothorax, coronary syndrome, pneumonia, and pleural effusion.  Despite the evaluation including history, exam, and testing, the cause of the shortness of breath remains unclear. However, during the ED stay, patient's condition improved, and at the time of discharge the shortness of breath is resolved, they are feeling well, and want to go home.  Patient will be discharged with strict return precautions and advice to follow up with primary MD within 24 hours for further evaluation.   FINAL CLINICAL IMPRESSION(S) / ED DIAGNOSES   Final diagnoses:  SOB (shortness of breath)   Rx / DC Orders   ED Discharge Orders     None      Note:  This document was prepared using Dragon voice recognition software and may include unintentional dictation errors.   Merwyn Katos, MD 08/24/22 [phone]

## 2022-08-24 NOTE — ED Triage Notes (Signed)
Pt comes from [facility] with c/o sob. Pt having to take big breath in between sentences. O2 was 100%. Pt does have some confusion. Pt did drive himself here.

## 2022-08-29 ENCOUNTER — Other Ambulatory Visit: Payer: Self-pay

## 2022-08-29 ENCOUNTER — Emergency Department
Admission: EM | Admit: 2022-08-29 | Discharge: 2022-08-29 | Disposition: A | Discharge status: Home / Self Care | Payer: Medicare Other | Attending: Emergency Medicine | Admitting: Emergency Medicine

## 2022-08-29 ENCOUNTER — Encounter: Payer: Self-pay | Admitting: Emergency Medicine

## 2022-08-29 DIAGNOSIS — R21 Rash and other nonspecific skin eruption: Secondary | ICD-10-CM | POA: Diagnosis present

## 2022-08-29 DIAGNOSIS — B029 Zoster without complications: Secondary | ICD-10-CM | POA: Insufficient documentation

## 2022-08-29 MED ORDER — TRAMADOL HCL 50 MG PO TABS
50.0000 mg | ORAL_TABLET | Freq: Four times a day (QID) | ORAL | 0 refills | Status: DC | PRN
Start: 2022-08-29 — End: 2023-01-01

## 2022-08-29 NOTE — ED Triage Notes (Signed)
Patient to ED via POV for rash/pain to left arm x1 week. Concerned for shingles.   Cyril Loosen, MD in triage assessing patient.

## 2022-08-29 NOTE — ED Provider Notes (Signed)
   [facility] Provider Note    Event Date/Time   First MD Initiated Contact with Patient 08/29/22 1722     (approximate)   History   Rash   HPI  Albert Harrison is a 89 y.o. male who presents with complaints of a rash to his left arm which developed about a week ago.  He reports it is painful, no other symptoms     Physical Exam   Triage Vital Signs: ED Triage Vitals [08/29/22 1723]  Encounter Vitals Group     BP (!) 140/87     Systolic BP Percentile      Diastolic BP Percentile      Pulse Rate 67     Resp 18     Temp 98.9 F (37.2 C)     Temp Source Oral     SpO2 92 %     Weight      Height      Head Circumference      Peak Flow      Pain Score 10     Pain Loc      Pain Education      Exclude from Growth Chart     Most recent vital signs: Vitals:   08/29/22 1723  BP: (!) 140/87  Pulse: 67  Resp: 18  Temp: 98.9 F (37.2 C)  SpO2: 92%     General: Awake, no distress.  CV:  Good peripheral perfusion.  Resp:  Normal effort.  Abd:  No distention.  Other:  Rash to the left arm extending onto the upper back consistent with shingles   ED Results / Procedures / Treatments   Labs (all labs ordered are listed, but only abnormal results are displayed) Labs Reviewed - No data to display   EKG     RADIOLOGY     PROCEDURES:  Critical Care performed:   Procedures   MEDICATIONS ORDERED IN ED: Medications - No data to display   IMPRESSION / MDM / ASSESSMENT AND PLAN / ED COURSE  I reviewed the triage vital signs and the nursing notes. Patient's presentation is most consistent with acute, uncomplicated illness.  Patient's rash is consistent with shingles.  He has had it for over a week, no indication for anti virals at this point.  He will need analgesics for pain from shingles, no indication for further workup at this time, appropriate for discharge with outpatient follow-up        FINAL CLINICAL IMPRESSION(S) /  ED DIAGNOSES   Final diagnoses:  Herpes zoster without complication     Rx / DC Orders   ED Discharge Orders          Ordered    traMADol (ULTRAM) 50 MG tablet  Every 6 hours PRN        08/29/22 1725    traMADol (ULTRAM) 50 MG tablet  Every 6 hours PRN        Pending             Note:  This document was prepared using Dragon voice recognition software and may include unintentional dictation errors.   Jene Every, MD 08/29/22 [phone]

## 2022-08-31 ENCOUNTER — Encounter (INDEPENDENT_AMBULATORY_CARE_PROVIDER_SITE_OTHER): Payer: Self-pay | Admitting: Nurse Practitioner

## 2022-08-31 ENCOUNTER — Ambulatory Visit (INDEPENDENT_AMBULATORY_CARE_PROVIDER_SITE_OTHER): Payer: Medicare Other | Admitting: Nurse Practitioner

## 2022-08-31 VITALS — BP 166/99 | HR 80

## 2022-08-31 DIAGNOSIS — E782 Mixed hyperlipidemia: Secondary | ICD-10-CM | POA: Diagnosis not present

## 2022-08-31 DIAGNOSIS — I10 Essential (primary) hypertension: Secondary | ICD-10-CM

## 2022-08-31 DIAGNOSIS — I890 Lymphedema, not elsewhere classified: Secondary | ICD-10-CM

## 2022-09-01 ENCOUNTER — Encounter (INDEPENDENT_AMBULATORY_CARE_PROVIDER_SITE_OTHER): Payer: Self-pay | Admitting: Nurse Practitioner

## 2022-09-01 NOTE — Progress Notes (Signed)
Subjective:    Patient ID: Albert Harrison, male    DOB: 06-13-1925, 89 y.o.   MRN: [id number] Chief Complaint  Patient presents with   Follow-up    3 month follow up    The patient is a 89 year old male presents today for follow-up of his lymphedema.  He notes that he has significant swelling in the right lower extremity has minimal edema.  This has been ongoing for the patient for several years.  The patient has previously had venous reflux studies which shows no evidence of DVT or significant superficial venous reflux in either the deep or superficial systems.  He has been wearing compression socks diligently as well as elevating his leg.  He has a lymphedema pump at home but does not utilize it regularly.  He notes that he uses that sporadically.  There are no wounds or ulcerations.  There is no evidence of cellulitis today.  He does note that there is some discomfort from the lymphedema as well.    Review of Systems  Cardiovascular:  Positive for leg swelling.  All other systems reviewed and are negative.      Objective:   Physical Exam Vitals reviewed.  HENT:     Head: Normocephalic.  Cardiovascular:     Rate and Rhythm: Normal rate.  Pulmonary:     Effort: Pulmonary effort is normal.  Musculoskeletal:     Right lower leg: 3+ Edema present.  Skin:    General: Skin is warm and dry.  Neurological:     Mental Status: He is alert and oriented to person, place, and time.  Psychiatric:        Mood and Affect: Mood normal.        Behavior: Behavior normal.        Thought Content: Thought content normal.        Judgment: Judgment normal.     BP (!) 166/99 (BP Location: Right Arm)   Pulse 80   Past Medical History:  Diagnosis Date   Abdominal pain    Anemia    BPH (benign prostatic hypertrophy) with urinary obstruction    Bradycardia    Complete heart block (HCC)    Diabetes (HCC)    Hard of hearing    Hyperlipidemia    Hypertension    Prostatitis    Renal cyst     Renal stone    Sick sinus syndrome (HCC)    Syncope    Tachycardia    Thrombocytopenia (HCC)    Tinea pedis    Venous stasis    Wears dentures    partial lower   Wears hearing aid in both ears     Social History   Socioeconomic History   Marital status: Married    Spouse name: Not on file   Number of children: Not on file   Years of education: Not on file   Highest education level: Not on file  Occupational History   Not on file  Tobacco Use   Smoking status: Never   Smokeless tobacco: Never  Vaping Use   Vaping status: Never Used  Substance and Sexual Activity   Alcohol use: No    Alcohol/week: 0.0 standard drinks of alcohol   Drug use: No   Sexual activity: Not on file  Other Topics Concern   Not on file  Social History Narrative   Not on file   Social Determinants of Health   Financial Resource Strain: Low Risk  (08/03/2022)   Received  from [facility]   Overall Financial Resource Strain (CARDIA)    Difficulty of Paying Living Expenses: Not hard at all  Food Insecurity: No Food Insecurity (08/03/2022)   Received from [facility]   Hunger Vital Sign    Worried About Running Out of Food in the Last Year: Never true    Ran Out of Food in the Last Year: Never true  Transportation Needs: No Transportation Needs (08/03/2022)   Received from [facility] - Transportation    In the past 12 months, has lack of transportation kept you from medical appointments or from getting medications?: No    Lack of Transportation (Non-Medical): No  Physical Activity: Not on file  Stress: Not on file  Social Connections: Not on file  Intimate Partner Violence: Not on file    Past Surgical History:  Procedure Laterality Date   HERNIA REPAIR     PACEMAKER LEADLESS INSERTION N/A 05/24/2022   Procedure: PACEMAKER LEADLESS INSERTION;  Surgeon: Marcina Millard, MD;  Location: [facility] INVASIVE CV LAB;  Service:  Cardiovascular;  Laterality: N/A;    History reviewed. No pertinent family history.  Allergies  Allergen Reactions   Morphine Itching    Mental status changes       Latest Ref Rng & Units 08/24/2022   11:28 AM 06/18/2022   11:10 AM 05/25/2022    8:04 AM  CBC  WBC 4.0 - 10.5 K/uL 6.7  4.8    Hemoglobin 13.0 - 17.0 g/dL 56.2  13.0  86.5   Hematocrit 39.0 - 52.0 % 51.0  45.8  47.2   Platelets 150 - 400 K/uL 120  128        CMP     Component Value Date/Time   NA 139 08/24/2022 1128   NA 143 11/16/2013 0513   K 4.4 08/24/2022 1128   K 4.0 11/16/2013 0513   CL 103 08/24/2022 1128   CL 103 11/16/2013 0513   CO2 28 08/24/2022 1128   CO2 31 11/16/2013 0513   GLUCOSE 93 08/24/2022 1128   GLUCOSE 155 (H) 11/16/2013 0513   BUN 29 (H) 08/24/2022 1128   BUN 26 (H) 11/16/2013 0513   CREATININE 1.21 08/24/2022 1128   CREATININE 1.74 (H) 11/16/2013 0513   CALCIUM 9.1 08/24/2022 1128   CALCIUM 8.3 (L) 11/16/2013 0513   PROT 6.4 (L) 06/18/2022 1110   PROT 6.9 11/16/2013 0513   ALBUMIN 4.4 06/18/2022 1110   ALBUMIN 3.8 11/16/2013 0513   AST 18 06/18/2022 1110   AST 23 11/16/2013 0513   ALT 15 06/18/2022 1110   ALT 24 11/16/2013 0513   ALKPHOS 70 06/18/2022 1110   ALKPHOS 88 11/16/2013 0513   BILITOT 1.2 06/18/2022 1110   BILITOT 0.8 11/16/2013 0513   GFRNONAA 55 (L) 08/24/2022 1128   GFRNONAA 40 (L) 11/16/2013 0513   GFRNONAA 46 (L) 06/13/2012 0448     No results found.     Assessment & Plan:   1. Lymphedema I have discussion with the patient and his son regarding his lymphedema.  Currently the patient is doing fairly good with wearing medical grade compression socks and elevating his legs however he has not been utilizing the lymphedema pump that he had at home.  He notes that she uses very sporadic.  Based on this we will have the patient utilize the lymphedema pump on a daily basis for at least an hour.  In addition to the other conservative therapies.  We will have  her return in 3 months to determine if there has been any improvement in his symptoms.  I do not as lymphedema clinic may be beneficial for the patient.  2. Primary hypertension Continue antihypertensive medications as already ordered, these medications have been reviewed and there are no changes at this time.  3. Mixed hyperlipidemia Continue statin as ordered and reviewed, no changes at this time   Current Outpatient Medications on File Prior to Visit  Medication Sig Dispense Refill   chlorthalidone (HYGROTON) 25 MG tablet Take 25 mg by mouth daily.     furosemide (LASIX) 20 MG tablet Take 20 mg by mouth daily as needed for fluid.      gabapentin (NEURONTIN) 100 MG capsule Take 100 mg by mouth 2 (two) times daily.     H[place]nut 300 MG CAPS Take 1 capsule by mouth daily.     hydrALAZINE (APRESOLINE) 10 MG tablet Take 10 mg by mouth 2 (two) times daily.     ibuprofen (ADVIL) 200 MG tablet Take 400 mg by mouth every 6 (six) hours as needed.     losartan (COZAAR) 100 MG tablet Take 100 mg by mouth daily.     meloxicam (MOBIC) 7.5 MG tablet Take 7.5 mg by mouth daily.     Misc Natural Products (JOINT HEALTH PO) Take 1 tablet by mouth daily.     Multiple Vitamins-Minerals (PRESERVISION AREDS 2+MULTI VIT PO) Take 1 capsule by mouth daily.     Nutritional Supplements (L-GLUTAMINE/CHOLINE/INOSITOL PO) Take 500 mg by mouth daily.     traMADol (ULTRAM) 50 MG tablet Take 1 tablet (50 mg total) by mouth every 6 (six) hours as needed. 20 tablet 0   valACYclovir (VALTREX) 1000 MG tablet Take 1,000 mg by mouth 2 (two) times daily.     VITAMIN D, ERGOCALCIFEROL, PO Take 1,250 mcg by mouth once a week.     No current facility-administered medications on file prior to visit.    There are no Patient Instructions on file for this visit. No follow-ups on file.   Georgiana Spinner, NP

## 2022-09-30 ENCOUNTER — Other Ambulatory Visit: Payer: Self-pay

## 2022-09-30 ENCOUNTER — Emergency Department
Admission: EM | Admit: 2022-09-30 | Discharge: 2022-09-30 | Disposition: A | Discharge status: Home / Self Care | Payer: Medicare Other | Attending: Emergency Medicine | Admitting: Emergency Medicine

## 2022-09-30 DIAGNOSIS — B0229 Other postherpetic nervous system involvement: Secondary | ICD-10-CM | POA: Insufficient documentation

## 2022-09-30 DIAGNOSIS — R21 Rash and other nonspecific skin eruption: Secondary | ICD-10-CM

## 2022-09-30 MED ORDER — GABAPENTIN 100 MG PO CAPS: 100.0000 mg | ORAL_CAPSULE | Freq: Three times a day (TID) | ORAL | 0 refills | Status: AC

## 2022-09-30 MED ORDER — ACETAMINOPHEN 325 MG PO TABS
650.0000 mg | ORAL_TABLET | Freq: Once | ORAL | Status: AC
  Administered 2022-09-30: 650 mg via ORAL
  Filled 2022-09-30: qty 2

## 2022-09-30 MED ORDER — CAPSAICIN 0.025 % EX CREA: TOPICAL_CREAM | Freq: Two times a day (BID) | CUTANEOUS | Status: DC

## 2022-09-30 MED ORDER — GABAPENTIN 100 MG PO CAPS: 100.0000 mg | ORAL_CAPSULE | Freq: Two times a day (BID) | ORAL | 0 refills | Status: AC

## 2022-09-30 MED ORDER — CAPSAICIN 0.035 % EX CREA: 1.0000 "application " | TOPICAL_CREAM | Freq: Two times a day (BID) | CUTANEOUS | 0 refills | Status: DC

## 2022-09-30 MED ORDER — HYDROCODONE-ACETAMINOPHEN 5-325 MG PO TABS
1.0000 | ORAL_TABLET | Freq: Once | ORAL | Status: AC
  Administered 2022-09-30: 1 via ORAL
  Filled 2022-09-30: qty 1

## 2022-09-30 MED ORDER — LIDOCAINE 5 % EX PTCH
1.0000 | MEDICATED_PATCH | CUTANEOUS | Status: DC
  Administered 2022-09-30: 1 via TRANSDERMAL
  Filled 2022-09-30: qty 1

## 2022-09-30 NOTE — ED Provider Notes (Signed)
[facility] Emergency Department Provider Note     Event Date/Time   First MD Initiated Contact with Patient 09/30/22 1507     (approximate)   History   Rash   HPI  Kingson Welles is a 89 y.o. male presents to the ED with complaint of left arm rash and pain.  Patient was diagnosed with shingles x 4 weeks ago.  No relief with current pain management.  Denies other symptoms at this time.  Patient also recovering from a rotator cuff injury on same arm.  Patient comes to ED for pain control.     Physical Exam   Triage Vital Signs: ED Triage Vitals  Encounter Vitals Group     BP 09/30/22 1419 110/69     Systolic BP Percentile --      Diastolic BP Percentile --      Pulse Rate 09/30/22 1419 67     Resp 09/30/22 1419 18     Temp 09/30/22 1419 97.8 F (36.6 C)     Temp Source 09/30/22 1419 Oral     SpO2 09/30/22 1419 98 %     Weight 09/30/22 1420 170 lb (77.1 kg)     Height 09/30/22 1420 5\' 7"  (1.702 m)     Head Circumference --      Peak Flow --      Pain Score 09/30/22 1420 10     Pain Loc --      Pain Education --      Exclude from Growth Chart --     Most recent vital signs: Vitals:   09/30/22 1419  BP: 110/69  Pulse: 67  Resp: 18  Temp: 97.8 F (36.6 C)  SpO2: 98%    General Awake, no distress.  HEENT NCAT. PERRL. EOMI. No rhinorrhea. Mucous membranes are moist.  CV:  Good peripheral perfusion. RRR RESP:  Normal effort. LCTAB ABD:  No distention.  Other:  Rash to left arm flexor aspect.  Rash on left side of upper back.  Rash on midline thoracic area. Tender.   ED Results / Procedures / Treatments   Labs (all labs ordered are listed, but only abnormal results are displayed) Labs Reviewed - No data to display  No results found.  PROCEDURES:  Critical Care performed: No  Procedures  MEDICATIONS ORDERED IN ED: Medications  lidocaine (LIDODERM) 5 % 1 patch (1 patch Transdermal Patch Applied 09/30/22 1543)   HYDROcodone-acetaminophen (NORCO/VICODIN) 5-325 MG per tablet 1 tablet (1 tablet Oral Given 09/30/22 1528)  acetaminophen (TYLENOL) tablet 650 mg (650 mg Oral Given 09/30/22 1529)    IMPRESSION / MDM / ASSESSMENT AND PLAN / ED COURSE  I reviewed the triage vital signs and the nursing notes.                               89 y.o. male presents to the emergency department for evaluation and treatment of rash consistent with shingles. See HPI for further details.   Differential diagnosis includes, but is not limited to shingles, contact dermatitis, post-herpatic neuralgia    Patient's presentation is most consistent with acute complicated illness / injury requiring diagnostic workup.  Plan: Patient will receive pain management in ED with hydrocodone and Tylenol and lidoderm patch.  Patient is in stable condition for discharge and outpatient follow-up.  Will discharge with prescription for increase of gabapentin dose to 200 mg 3 times a day starting tomorrow.  Advised to increase hydrocodone to 3 times a day.  Prescription for capsaicin cream.  Close follow-up with primary care is encouraged. Patient is given ED precautions to return to the ED for any worsening or new symptoms. Patient verbalizes understanding. All questions and concerns were addressed during ED visit.     FINAL CLINICAL IMPRESSION(S) / ED DIAGNOSES   Final diagnoses:  Postherpetic neuralgia  Rash   Rx / DC Orders   ED Discharge Orders          Ordered    Capsaicin 0.035 % CREA  2 times daily        09/30/22 1629    gabapentin (NEURONTIN) 100 MG capsule  3 times daily        09/30/22 1637    gabapentin (NEURONTIN) 100 MG capsule  2 times daily        09/30/22 1637           Note:  This document was prepared using Dragon voice recognition software and may include unintentional dictation errors.    Romeo Apple, Alante Weimann A, PA-C 09/30/22 1736    Corena Herter, MD 09/30/22 2016

## 2022-09-30 NOTE — Discharge Instructions (Addendum)
You were evaluated in the ED for a rash on your left arm and pain management.   Increase taking your hydrocodone-acetaminophen 3 times a day every 6 hours. Take last dose an hour before bedtime to help with sleep.    Take gabapentin 100 mg every 8 hours today (you took 2 already today, take one more) and tomorrow increase gabapentin to 200 mg 3 times a day.    Schedule appointment with your primary care for follow-up and discuss increasing gabapentin dose to 300 mg if symptoms persist.  Lidocaine patches can be picked up over-the-counter. Alternate application of lidocaine patch and capsaicin cream.  Use capsaicin cream during the day and lidocaine patch at night.

## 2022-09-30 NOTE — ED Provider Notes (Signed)
Shared visit   Presents to the emergency department with left arm pain.  Has a history of zoster and has been having ongoing issues with postherpetic neuralgia.  Currently taking gabapentin 100 mg twice daily.  Hydrocodone 5 mg twice daily.  Concern for postherpetic neuralgia pain.  No signs of an overlying bacterial infection.  Does have a history of CKD.  Discussed increasing hydrocodone to 3 times daily.  Capsaicin cream and Lidoderm patches.  Also discussed increasing gabapentin and close follow-up with primary care physician.  Given return precautions.   Corena Herter, MD 09/30/22 [phone]

## 2022-09-30 NOTE — ED Triage Notes (Signed)
Per family, pt has shingles to the right arm for 4 weeks and has pain to that arm. Pt also had left rotator cuff injury 4 weeks ago, and was seen by [employer]

## 2022-11-16 ENCOUNTER — Telehealth (INDEPENDENT_AMBULATORY_CARE_PROVIDER_SITE_OTHER): Payer: Self-pay

## 2022-11-16 ENCOUNTER — Other Ambulatory Visit (INDEPENDENT_AMBULATORY_CARE_PROVIDER_SITE_OTHER): Payer: Self-pay

## 2022-11-16 NOTE — Telephone Encounter (Signed)
[name] called stating that Albert Harrison will be moving to [facility] 11/23/22. She would like to know if he's able to be seen next week before he leave because his leg isn't doing too well.   Patient also has an appt on 12/03/22 that she would like to cancel as well.

## 2022-11-21 ENCOUNTER — Ambulatory Visit (INDEPENDENT_AMBULATORY_CARE_PROVIDER_SITE_OTHER): Payer: Medicare Other | Admitting: Nurse Practitioner

## 2022-12-03 ENCOUNTER — Ambulatory Visit (INDEPENDENT_AMBULATORY_CARE_PROVIDER_SITE_OTHER): Payer: Medicare Other | Admitting: Vascular Surgery

## 2022-12-09 DEATH — deceased

## 2023-01-13 IMAGING — US US EXTREM LOW VENOUS*R*
1 series · 13 of 24 positions shown · non-contrast
Comparison: None.

CLINICAL DATA: Right leg pain and swelling for 5 months



[Series 1: us extrem low venous*right* · 13 of 38 slices shown]
[im 1/38]
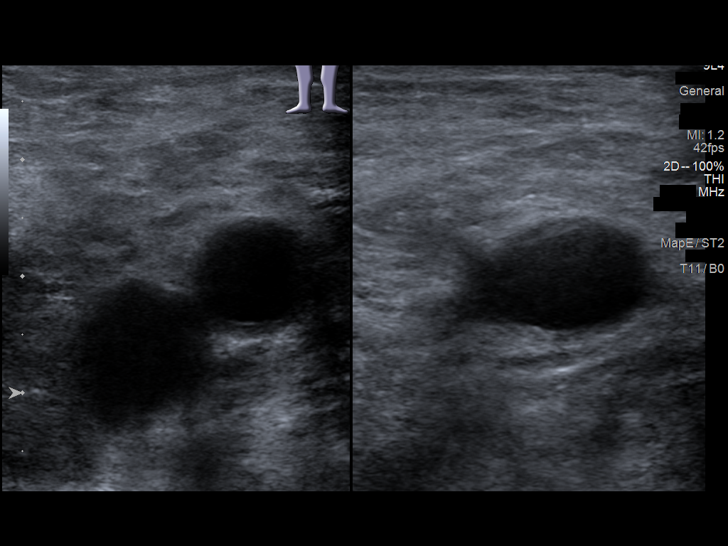
[im 4/38]
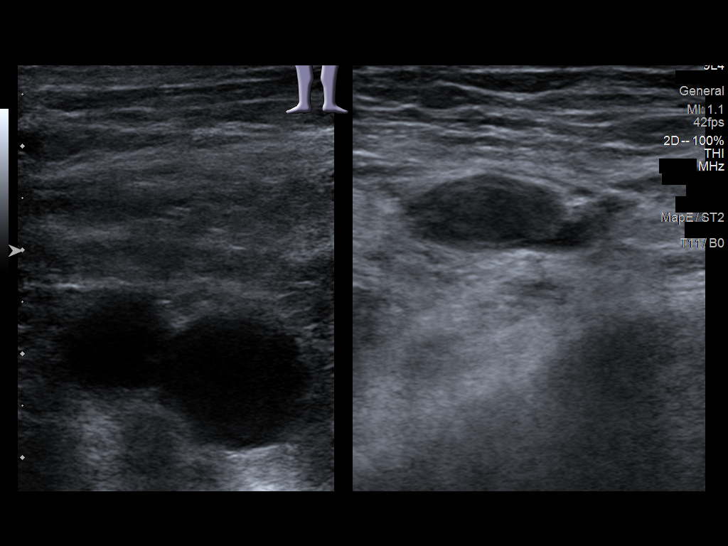
[im 7/38]
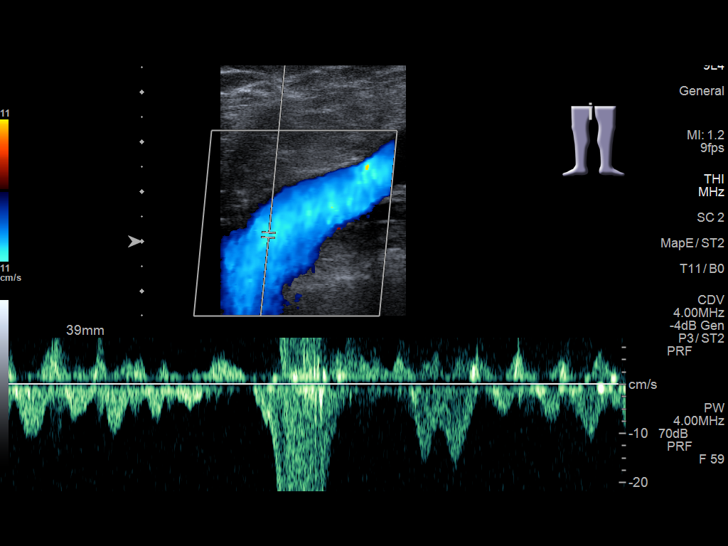
[im 10/38]
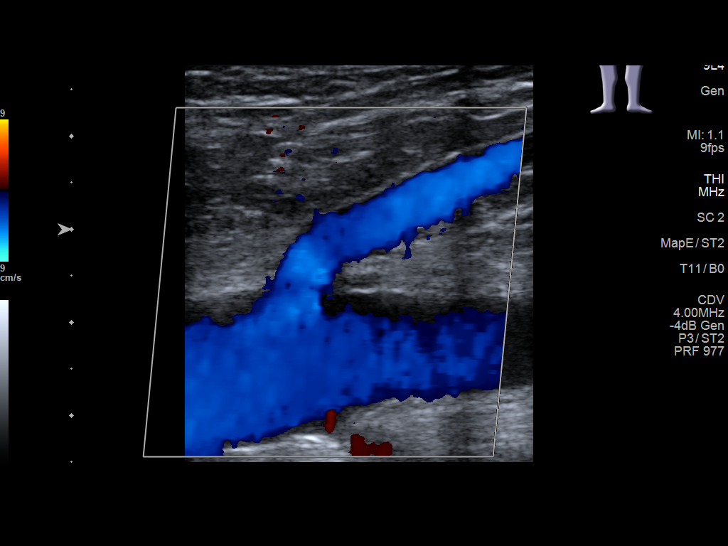
[im 13/38]
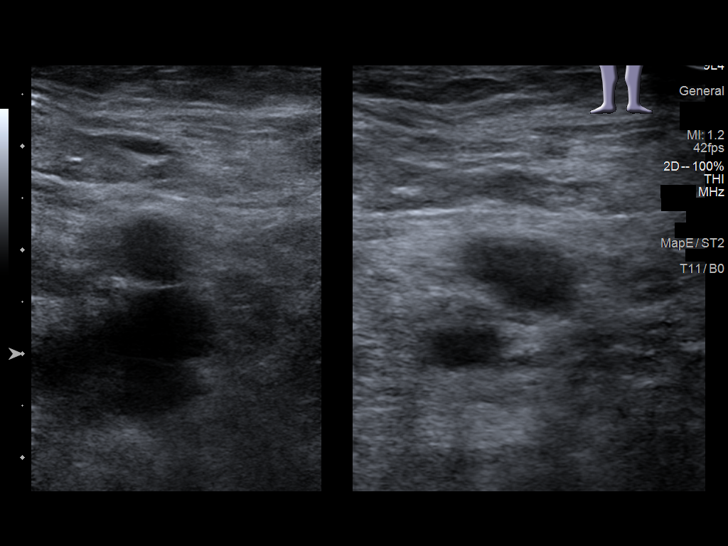
[im 17/38]
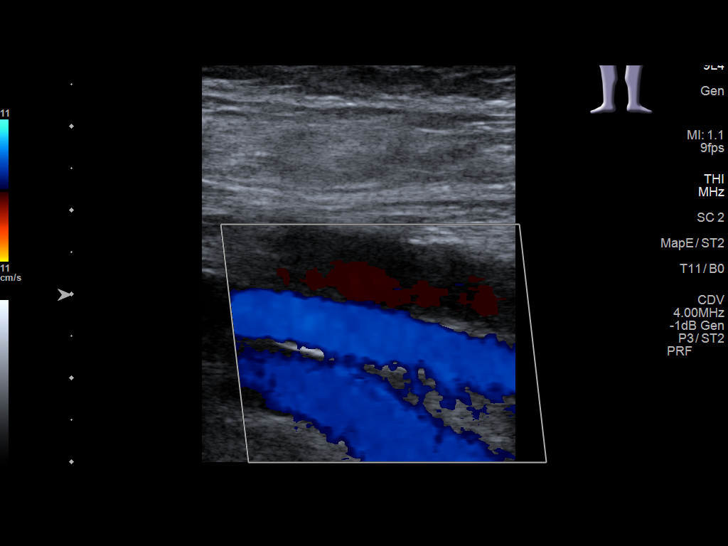
[im 20/38]
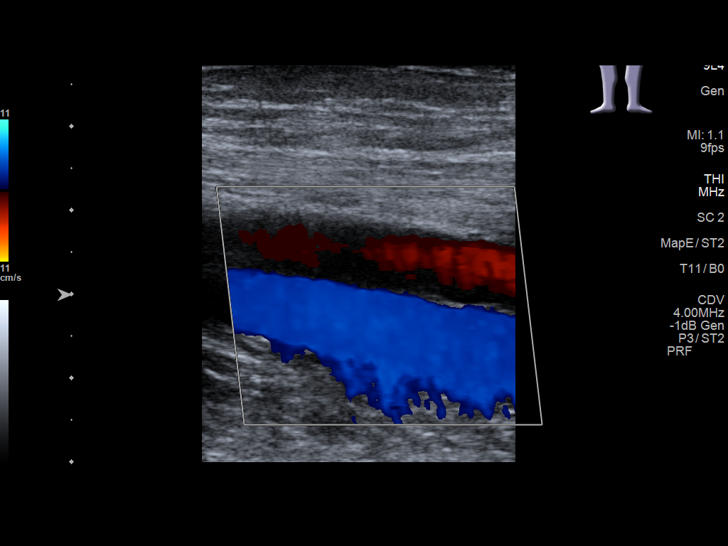
[im 21/38]
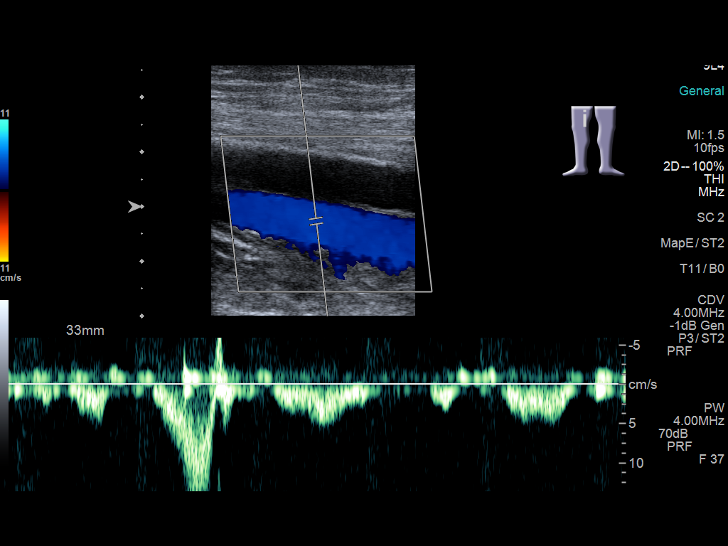
[im 25/38]
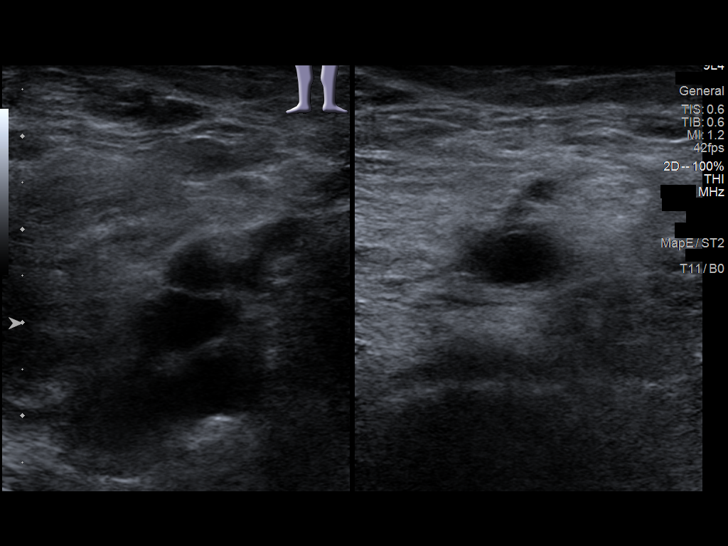
[im 28/38]
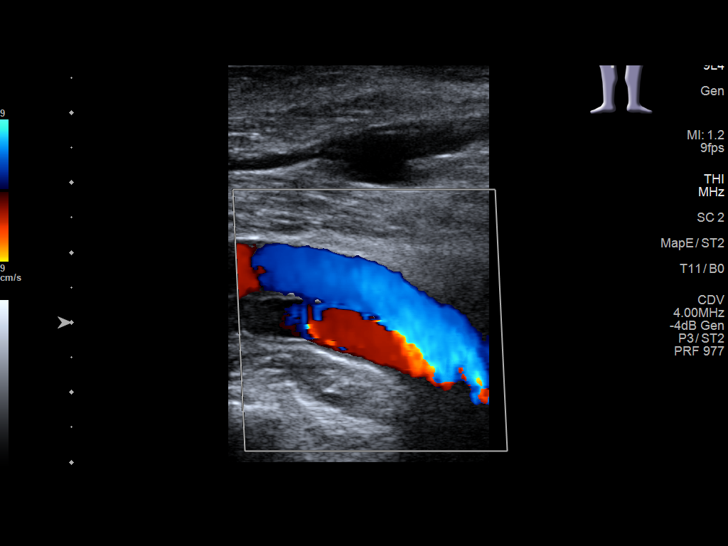
[im 31/38]
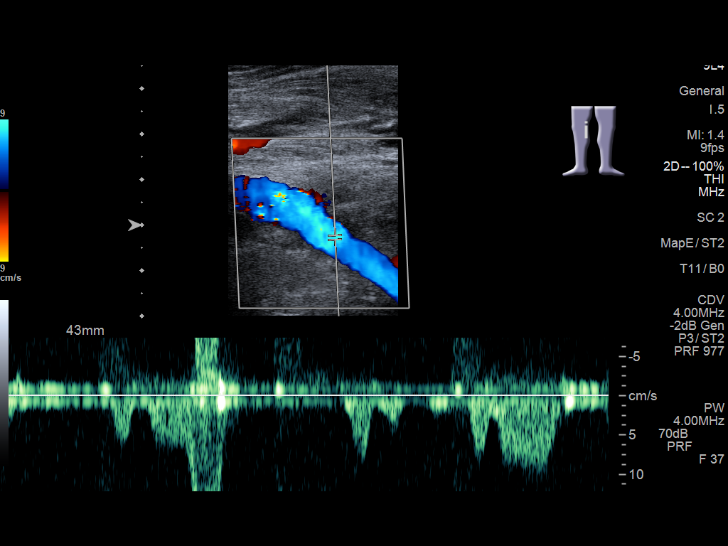
[im 34/38]
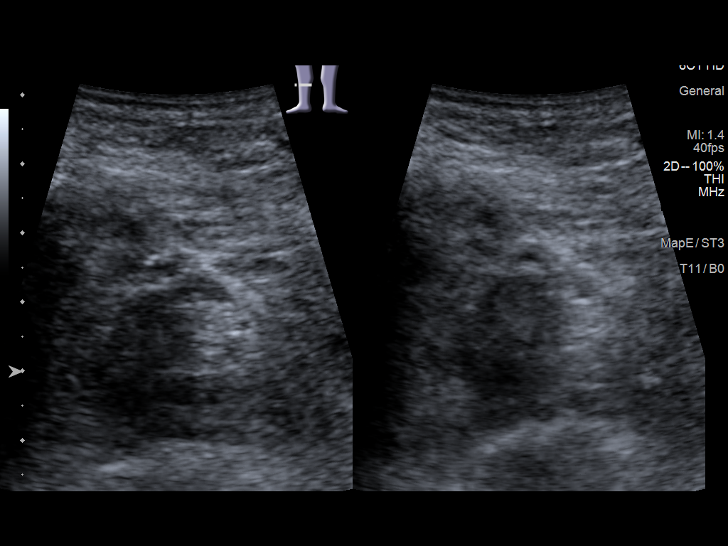
[im 38/38]
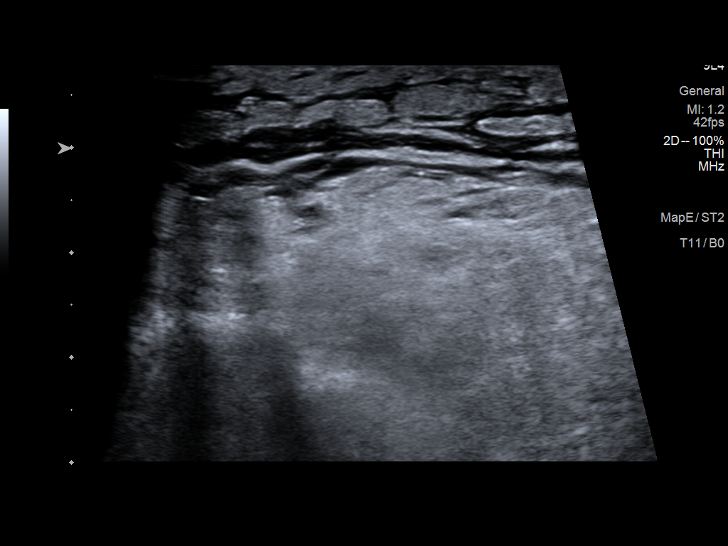

[13 of 24 positions shown; findings below may reference images not displayed]

FINDINGS: Contralateral Common Femoral Vein: Respiratory phasicity is normal
and symmetric with the symptomatic side. No evidence of thrombus.
Normal compressibility.

Common Femoral Vein: No evidence of thrombus. Normal
compressibility, respiratory phasicity and response to augmentation.

Saphenofemoral Junction: No evidence of thrombus. Normal
compressibility and flow on color Doppler imaging.

Profunda Femoral Vein: No evidence of thrombus. Normal
compressibility and flow on color Doppler imaging.

Femoral Vein: No evidence of thrombus. Normal compressibility,
respiratory phasicity and response to augmentation.

Popliteal Vein: No evidence of thrombus. Normal compressibility,
respiratory phasicity and response to augmentation.

Calf Veins: No evidence of thrombus. Normal compressibility and flow
on color Doppler imaging.

Other Findings:  Subcutaneous calf edema noted.
IMPRESSION: No evidence of deep venous thrombosis.
